# Patient Record
Sex: Female | Born: 1935 | Race: Black or African American | Hispanic: No | Marital: Single | Smoking: Former smoker
Health system: Southern US, Community
[De-identification: ages and names within clinical notes are randomized; demographics above are authoritative.]

## PROBLEM LIST (undated history)

## (undated) DIAGNOSIS — N185 Chronic kidney disease, stage 5: Secondary | ICD-10-CM

## (undated) DIAGNOSIS — D649 Anemia, unspecified: Secondary | ICD-10-CM

## (undated) DIAGNOSIS — I10 Essential (primary) hypertension: Secondary | ICD-10-CM

## (undated) HISTORY — PX: ABDOMINAL HYSTERECTOMY: SHX81

---

## 2008-04-20 ENCOUNTER — Encounter: Payer: Self-pay | Admitting: Emergency Medicine

## 2008-04-20 ENCOUNTER — Inpatient Hospital Stay (HOSPITAL_COMMUNITY): Admission: AD | Admit: 2008-04-20 | Discharge: 2008-04-27 | Payer: Self-pay | Admitting: Cardiology

## 2008-04-20 ENCOUNTER — Ambulatory Visit: Payer: Self-pay | Admitting: Cardiology

## 2008-04-21 ENCOUNTER — Encounter: Payer: Self-pay | Admitting: Cardiology

## 2008-05-04 ENCOUNTER — Ambulatory Visit: Payer: Self-pay | Admitting: Cardiology

## 2008-05-04 LAB — CONVERTED CEMR LAB
BUN: 14 mg/dL (ref 6–23)
CO2: 29 meq/L (ref 19–32)
Calcium: 8.5 mg/dL (ref 8.4–10.5)
Chloride: 105 meq/L (ref 96–112)
Creatinine, Ser: 1 mg/dL (ref 0.4–1.2)
GFR calc Af Amer: 70 mL/min
GFR calc non Af Amer: 58 mL/min
Glucose, Bld: 90 mg/dL (ref 70–99)
Potassium: 4.3 meq/L (ref 3.5–5.1)
Sodium: 139 meq/L (ref 135–145)

## 2008-05-17 ENCOUNTER — Ambulatory Visit: Payer: Self-pay | Admitting: Cardiology

## 2008-05-25 ENCOUNTER — Encounter (HOSPITAL_COMMUNITY): Admission: RE | Admit: 2008-05-25 | Discharge: 2008-08-23 | Payer: Self-pay | Admitting: Cardiovascular Disease

## 2008-06-29 ENCOUNTER — Ambulatory Visit: Payer: Self-pay | Admitting: Cardiovascular Disease

## 2008-06-29 LAB — CONVERTED CEMR LAB
BUN: 22 mg/dL (ref 6–23)
CO2: 24 meq/L (ref 19–32)
Calcium: 8.1 mg/dL — ABNORMAL LOW (ref 8.4–10.5)
Chloride: 103 meq/L (ref 96–112)
Creatinine, Ser: 1.2 mg/dL (ref 0.4–1.2)
GFR calc Af Amer: 57 mL/min
GFR calc non Af Amer: 47 mL/min
Glucose, Bld: 83 mg/dL (ref 70–99)
Potassium: 6.1 meq/L (ref 3.5–5.1)
Sodium: 131 meq/L — ABNORMAL LOW (ref 135–145)

## 2008-07-03 ENCOUNTER — Ambulatory Visit: Payer: Self-pay | Admitting: Cardiovascular Disease

## 2008-07-03 LAB — CONVERTED CEMR LAB
BUN: 22 mg/dL (ref 6–23)
BUN: 22 mg/dL (ref 6–23)
CO2: 24 meq/L (ref 19–32)
CO2: 24 meq/L (ref 19–32)
Calcium: 8.1 mg/dL — ABNORMAL LOW (ref 8.4–10.5)
Calcium: 7.6 mg/dL — ABNORMAL LOW (ref 8.4–10.5)
Chloride: 103 meq/L (ref 96–112)
Chloride: 103 meq/L (ref 96–112)
Creatinine, Ser: 1.2 mg/dL (ref 0.4–1.2)
Creatinine, Ser: 1 mg/dL (ref 0.4–1.2)
GFR calc Af Amer: 57 mL/min
GFR calc Af Amer: 70 mL/min
GFR calc non Af Amer: 47 mL/min
GFR calc non Af Amer: 58 mL/min
Glucose, Bld: 83 mg/dL (ref 70–99)
Glucose, Bld: 103 mg/dL — ABNORMAL HIGH (ref 70–99)
Potassium: 6.1 meq/L (ref 3.5–5.1)
Potassium: 4.5 meq/L (ref 3.5–5.1)
Sodium: 131 meq/L — ABNORMAL LOW (ref 135–145)
Sodium: 130 meq/L — ABNORMAL LOW (ref 135–145)

## 2008-07-12 ENCOUNTER — Ambulatory Visit: Payer: Self-pay | Admitting: Cardiology

## 2008-07-12 LAB — CONVERTED CEMR LAB
BUN: 28 mg/dL — ABNORMAL HIGH (ref 6–23)
Basophils Absolute: 0 10*3/uL (ref 0.0–0.1)
Basophils Relative: 0.6 % (ref 0.0–3.0)
CO2: 27 meq/L (ref 19–32)
Calcium: 7.6 mg/dL — ABNORMAL LOW (ref 8.4–10.5)
Chloride: 92 meq/L — ABNORMAL LOW (ref 96–112)
Creatinine, Ser: 1.2 mg/dL (ref 0.4–1.2)
Eosinophils Absolute: 0.6 10*3/uL (ref 0.0–0.7)
Eosinophils Relative: 11 % — ABNORMAL HIGH (ref 0.0–5.0)
GFR calc Af Amer: 56 mL/min
GFR calc non Af Amer: 47 mL/min
Glucose, Bld: 83 mg/dL (ref 70–99)
HCT: 31.7 % — ABNORMAL LOW (ref 36.0–46.0)
Hemoglobin: 11 g/dL — ABNORMAL LOW (ref 12.0–15.0)
Iron: 41 ug/dL — ABNORMAL LOW (ref 42–145)
Lymphocytes Relative: 27.3 % (ref 12.0–46.0)
MCHC: 34.7 g/dL (ref 30.0–36.0)
MCV: 92.1 fL (ref 78.0–100.0)
Monocytes Absolute: 0.4 10*3/uL (ref 0.1–1.0)
Monocytes Relative: 7.8 % (ref 3.0–12.0)
Neutro Abs: 3 10*3/uL (ref 1.4–7.7)
Neutrophils Relative %: 53.3 % (ref 43.0–77.0)
Platelets: 217 10*3/uL (ref 150–400)
Potassium: 4.3 meq/L (ref 3.5–5.1)
Pro B Natriuretic peptide (BNP): 309 pg/mL — ABNORMAL HIGH (ref 0.0–100.0)
RBC: 3.45 M/uL — ABNORMAL LOW (ref 3.87–5.11)
RDW: 13.7 % (ref 11.5–14.6)
Saturation Ratios: 26.2 % (ref 20.0–50.0)
Sodium: 126 meq/L — ABNORMAL LOW (ref 135–145)
Transferrin: 111.8 mg/dL — ABNORMAL LOW (ref >=212.0)
WBC: 5.5 10*3/uL (ref 4.5–10.5)

## 2008-07-19 ENCOUNTER — Encounter: Payer: Self-pay | Admitting: Cardiology

## 2008-07-19 ENCOUNTER — Ambulatory Visit: Payer: Self-pay | Admitting: Internal Medicine

## 2008-07-19 ENCOUNTER — Ambulatory Visit: Payer: Self-pay

## 2008-07-19 LAB — CONVERTED CEMR LAB
BUN: 28 mg/dL — ABNORMAL HIGH (ref 6–23)
CO2: 31 meq/L (ref 19–32)
Calcium: 8 mg/dL — ABNORMAL LOW (ref 8.4–10.5)
Chloride: 100 meq/L (ref 96–112)
Creatinine, Ser: 1.2 mg/dL (ref 0.4–1.2)
GFR calc Af Amer: 56 mL/min
GFR calc non Af Amer: 47 mL/min
Glucose, Bld: 81 mg/dL (ref 70–99)
Potassium: 4.7 meq/L (ref 3.5–5.1)
Pro B Natriuretic peptide (BNP): 310 pg/mL — ABNORMAL HIGH (ref 0.0–100.0)
Sodium: 135 meq/L (ref 135–145)

## 2008-07-28 ENCOUNTER — Ambulatory Visit: Payer: Self-pay | Admitting: Cardiovascular Disease

## 2008-08-02 ENCOUNTER — Ambulatory Visit: Payer: Self-pay | Admitting: Cardiovascular Disease

## 2008-08-02 LAB — CONVERTED CEMR LAB
BUN: 25 mg/dL — ABNORMAL HIGH (ref 6–23)
CO2: 33 meq/L — ABNORMAL HIGH (ref 19–32)
Calcium: 7.7 mg/dL — ABNORMAL LOW (ref 8.4–10.5)
Chloride: 96 meq/L (ref 96–112)
Creatinine, Ser: 1.3 mg/dL — ABNORMAL HIGH (ref 0.4–1.2)
GFR calc Af Amer: 51 mL/min
GFR calc non Af Amer: 43 mL/min
Glucose, Bld: 80 mg/dL (ref 70–99)
Potassium: 4.2 meq/L (ref 3.5–5.1)
Sodium: 133 meq/L — ABNORMAL LOW (ref 135–145)

## 2008-08-08 ENCOUNTER — Ambulatory Visit: Payer: Self-pay | Admitting: Cardiovascular Disease

## 2008-08-14 ENCOUNTER — Ambulatory Visit: Payer: Self-pay

## 2008-08-14 LAB — CONVERTED CEMR LAB
BUN: 29 mg/dL — ABNORMAL HIGH (ref 6–23)
CO2: 32 meq/L (ref 19–32)
Calcium: 7.5 mg/dL — ABNORMAL LOW (ref 8.4–10.5)
Chloride: 92 meq/L — ABNORMAL LOW (ref 96–112)
Creatinine, Ser: 1.3 mg/dL — ABNORMAL HIGH (ref 0.4–1.2)
GFR calc Af Amer: 51 mL/min
GFR calc non Af Amer: 43 mL/min
Glucose, Bld: 94 mg/dL (ref 70–99)
Potassium: 3.8 meq/L (ref 3.5–5.1)
Pro B Natriuretic peptide (BNP): 219 pg/mL — ABNORMAL HIGH (ref 0.0–100.0)
Sodium: 129 meq/L — ABNORMAL LOW (ref 135–145)

## 2008-08-24 ENCOUNTER — Encounter (HOSPITAL_COMMUNITY): Admission: RE | Admit: 2008-08-24 | Discharge: 2008-10-09 | Payer: Self-pay | Admitting: Cardiovascular Disease

## 2008-08-30 ENCOUNTER — Ambulatory Visit: Payer: Self-pay | Admitting: Cardiovascular Disease

## 2008-08-30 LAB — CONVERTED CEMR LAB
BUN: 23 mg/dL (ref 6–23)
CO2: 31 meq/L (ref 19–32)
Calcium: 7.5 mg/dL — ABNORMAL LOW (ref 8.4–10.5)
Chloride: 86 meq/L — ABNORMAL LOW (ref 96–112)
Creatinine, Ser: 1.4 mg/dL — ABNORMAL HIGH (ref 0.4–1.2)
GFR calc Af Amer: 47 mL/min
GFR calc non Af Amer: 39 mL/min
Glucose, Bld: 97 mg/dL (ref 70–99)
Potassium: 3.7 meq/L (ref 3.5–5.1)
Pro B Natriuretic peptide (BNP): 298 pg/mL — ABNORMAL HIGH (ref 0.0–100.0)
Sodium: 121 meq/L — ABNORMAL LOW (ref 135–145)

## 2008-09-05 ENCOUNTER — Ambulatory Visit: Payer: Self-pay | Admitting: Cardiovascular Disease

## 2008-09-05 ENCOUNTER — Ambulatory Visit: Payer: Self-pay | Admitting: Pulmonary Disease

## 2008-09-05 ENCOUNTER — Inpatient Hospital Stay (HOSPITAL_COMMUNITY): Admission: AD | Admit: 2008-09-05 | Discharge: 2008-09-14 | Payer: Self-pay | Admitting: Cardiovascular Disease

## 2008-09-21 ENCOUNTER — Ambulatory Visit: Payer: Self-pay | Admitting: Cardiology

## 2008-09-21 DIAGNOSIS — E876 Hypokalemia: Secondary | ICD-10-CM | POA: Insufficient documentation

## 2008-09-21 DIAGNOSIS — I5033 Acute on chronic diastolic (congestive) heart failure: Secondary | ICD-10-CM | POA: Insufficient documentation

## 2008-09-21 DIAGNOSIS — R635 Abnormal weight gain: Secondary | ICD-10-CM

## 2008-09-21 DIAGNOSIS — R0602 Shortness of breath: Secondary | ICD-10-CM | POA: Insufficient documentation

## 2008-09-21 DIAGNOSIS — J984 Other disorders of lung: Secondary | ICD-10-CM | POA: Insufficient documentation

## 2008-09-21 DIAGNOSIS — R188 Other ascites: Secondary | ICD-10-CM | POA: Insufficient documentation

## 2008-09-21 DIAGNOSIS — I2510 Atherosclerotic heart disease of native coronary artery without angina pectoris: Secondary | ICD-10-CM | POA: Insufficient documentation

## 2008-09-21 LAB — CONVERTED CEMR LAB
BUN: 32 mg/dL — ABNORMAL HIGH (ref 6–23)
CO2: 27 meq/L (ref 19–32)
Calcium: 7.8 mg/dL — ABNORMAL LOW (ref 8.4–10.5)
Chloride: 106 meq/L (ref 96–112)
Creatinine, Ser: 1.7 mg/dL — ABNORMAL HIGH (ref 0.4–1.2)
GFR calc Af Amer: 38 mL/min
GFR calc non Af Amer: 31 mL/min
Glucose, Bld: 101 mg/dL — ABNORMAL HIGH (ref 70–99)
Potassium: 4.5 meq/L (ref 3.5–5.1)
Pro B Natriuretic peptide (BNP): 605 pg/mL — ABNORMAL HIGH (ref 0.0–100.0)
Sodium: 137 meq/L (ref 135–145)

## 2008-10-11 ENCOUNTER — Ambulatory Visit: Payer: Self-pay | Admitting: Cardiovascular Disease

## 2008-10-11 LAB — CONVERTED CEMR LAB
ALT: 13 units/L (ref 0–35)
AST: 22 units/L (ref 0–37)
Albumin: 1 g/dL — ABNORMAL LOW (ref 3.5–5.2)
Alkaline Phosphatase: 78 units/L (ref 39–117)
BUN: 21 mg/dL (ref 6–23)
Bilirubin, Direct: 0.1 mg/dL (ref 0.0–0.3)
CO2: 27 meq/L (ref 19–32)
Calcium: 7.8 mg/dL — ABNORMAL LOW (ref 8.4–10.5)
Chloride: 109 meq/L (ref 96–112)
Creatinine, Ser: 1.8 mg/dL — ABNORMAL HIGH (ref 0.4–1.2)
GFR calc Af Amer: 35 mL/min
GFR calc non Af Amer: 29 mL/min
Glucose, Bld: 96 mg/dL (ref 70–99)
Potassium: 4 meq/L (ref 3.5–5.1)
Sodium: 140 meq/L (ref 135–145)
Total Bilirubin: 0.3 mg/dL (ref 0.3–1.2)
Total Protein: 4.7 g/dL — ABNORMAL LOW (ref 6.0–8.3)

## 2008-10-13 ENCOUNTER — Ambulatory Visit: Payer: Self-pay | Admitting: Cardiovascular Disease

## 2008-10-24 ENCOUNTER — Ambulatory Visit: Payer: Self-pay | Admitting: Cardiovascular Disease

## 2008-10-26 ENCOUNTER — Inpatient Hospital Stay (HOSPITAL_COMMUNITY): Admission: AD | Admit: 2008-10-26 | Discharge: 2008-10-27 | Payer: Self-pay | Admitting: Cardiovascular Disease

## 2008-10-26 ENCOUNTER — Ambulatory Visit: Payer: Self-pay | Admitting: Cardiovascular Disease

## 2008-11-10 ENCOUNTER — Ambulatory Visit: Payer: Self-pay | Admitting: Cardiovascular Disease

## 2008-11-10 ENCOUNTER — Encounter: Payer: Self-pay | Admitting: Cardiovascular Disease

## 2008-11-10 DIAGNOSIS — R233 Spontaneous ecchymoses: Secondary | ICD-10-CM

## 2008-11-10 DIAGNOSIS — R609 Edema, unspecified: Secondary | ICD-10-CM | POA: Insufficient documentation

## 2008-11-16 ENCOUNTER — Encounter (INDEPENDENT_AMBULATORY_CARE_PROVIDER_SITE_OTHER): Payer: Self-pay | Admitting: Nephrology

## 2008-11-16 ENCOUNTER — Ambulatory Visit (HOSPITAL_COMMUNITY): Admission: RE | Admit: 2008-11-16 | Discharge: 2008-11-16 | Payer: Self-pay | Admitting: Nephrology

## 2008-11-24 ENCOUNTER — Encounter (HOSPITAL_COMMUNITY): Admission: RE | Admit: 2008-11-24 | Discharge: 2009-02-22 | Payer: Self-pay | Admitting: Nephrology

## 2008-12-04 ENCOUNTER — Encounter: Admission: RE | Admit: 2008-12-04 | Discharge: 2008-12-04 | Payer: Self-pay | Admitting: Nephrology

## 2008-12-04 ENCOUNTER — Encounter: Payer: Self-pay | Admitting: Gastroenterology

## 2008-12-05 ENCOUNTER — Encounter: Admission: RE | Admit: 2008-12-05 | Discharge: 2008-12-05 | Payer: Self-pay | Admitting: Nephrology

## 2008-12-11 ENCOUNTER — Ambulatory Visit: Payer: Self-pay | Admitting: Cardiovascular Disease

## 2008-12-12 ENCOUNTER — Ambulatory Visit: Payer: Self-pay | Admitting: Gastroenterology

## 2008-12-26 ENCOUNTER — Ambulatory Visit: Payer: Self-pay | Admitting: Gastroenterology

## 2008-12-26 ENCOUNTER — Encounter: Payer: Self-pay | Admitting: Gastroenterology

## 2008-12-29 ENCOUNTER — Encounter: Payer: Self-pay | Admitting: Gastroenterology

## 2009-01-01 ENCOUNTER — Encounter: Payer: Self-pay | Admitting: Gastroenterology

## 2009-01-05 ENCOUNTER — Ambulatory Visit: Payer: Self-pay | Admitting: Hematology and Oncology

## 2009-01-12 ENCOUNTER — Encounter: Payer: Self-pay | Admitting: Gastroenterology

## 2009-01-19 ENCOUNTER — Encounter: Payer: Self-pay | Admitting: Gastroenterology

## 2009-02-13 ENCOUNTER — Encounter: Payer: Self-pay | Admitting: Cardiovascular Disease

## 2009-02-23 ENCOUNTER — Encounter (HOSPITAL_COMMUNITY): Admission: RE | Admit: 2009-02-23 | Discharge: 2009-05-24 | Payer: Self-pay | Admitting: Nephrology

## 2009-02-26 DIAGNOSIS — D126 Benign neoplasm of colon, unspecified: Secondary | ICD-10-CM | POA: Insufficient documentation

## 2009-02-26 DIAGNOSIS — K5730 Diverticulosis of large intestine without perforation or abscess without bleeding: Secondary | ICD-10-CM | POA: Insufficient documentation

## 2009-02-27 ENCOUNTER — Ambulatory Visit: Payer: Self-pay | Admitting: Gastroenterology

## 2009-02-27 DIAGNOSIS — K219 Gastro-esophageal reflux disease without esophagitis: Secondary | ICD-10-CM

## 2009-02-27 DIAGNOSIS — N055 Unspecified nephritic syndrome with diffuse mesangiocapillary glomerulonephritis: Secondary | ICD-10-CM | POA: Insufficient documentation

## 2009-02-27 DIAGNOSIS — R933 Abnormal findings on diagnostic imaging of other parts of digestive tract: Secondary | ICD-10-CM

## 2009-02-28 ENCOUNTER — Telehealth: Payer: Self-pay | Admitting: Gastroenterology

## 2009-03-19 ENCOUNTER — Telehealth: Payer: Self-pay | Admitting: Gastroenterology

## 2009-03-23 ENCOUNTER — Encounter: Payer: Self-pay | Admitting: Cardiovascular Disease

## 2009-05-25 ENCOUNTER — Encounter (HOSPITAL_COMMUNITY): Admission: RE | Admit: 2009-05-25 | Discharge: 2009-08-24 | Payer: Self-pay | Admitting: Nephrology

## 2009-05-31 ENCOUNTER — Ambulatory Visit: Payer: Self-pay | Admitting: Cardiovascular Disease

## 2009-06-07 ENCOUNTER — Encounter: Payer: Self-pay | Admitting: Cardiovascular Disease

## 2009-07-01 ENCOUNTER — Encounter: Payer: Self-pay | Admitting: Cardiovascular Disease

## 2009-08-27 ENCOUNTER — Encounter (HOSPITAL_COMMUNITY): Admission: RE | Admit: 2009-08-27 | Discharge: 2009-11-25 | Payer: Self-pay | Admitting: Nephrology

## 2009-11-26 ENCOUNTER — Encounter: Payer: Self-pay | Admitting: Cardiovascular Disease

## 2009-12-07 ENCOUNTER — Encounter (HOSPITAL_COMMUNITY): Admission: RE | Admit: 2009-12-07 | Discharge: 2010-03-07 | Payer: Self-pay | Admitting: Nephrology

## 2010-03-15 ENCOUNTER — Encounter (HOSPITAL_COMMUNITY): Admission: RE | Admit: 2010-03-15 | Discharge: 2010-06-13 | Payer: Self-pay | Admitting: Nephrology

## 2010-03-23 IMAGING — CR DG CHEST 2V
2 series · 2 of 2 positions shown · non-contrast
Comparison: 04/22/2008

CLINICAL DATA: Congestive heart failure

CHEST - 2 VIEW

[w chest pa]
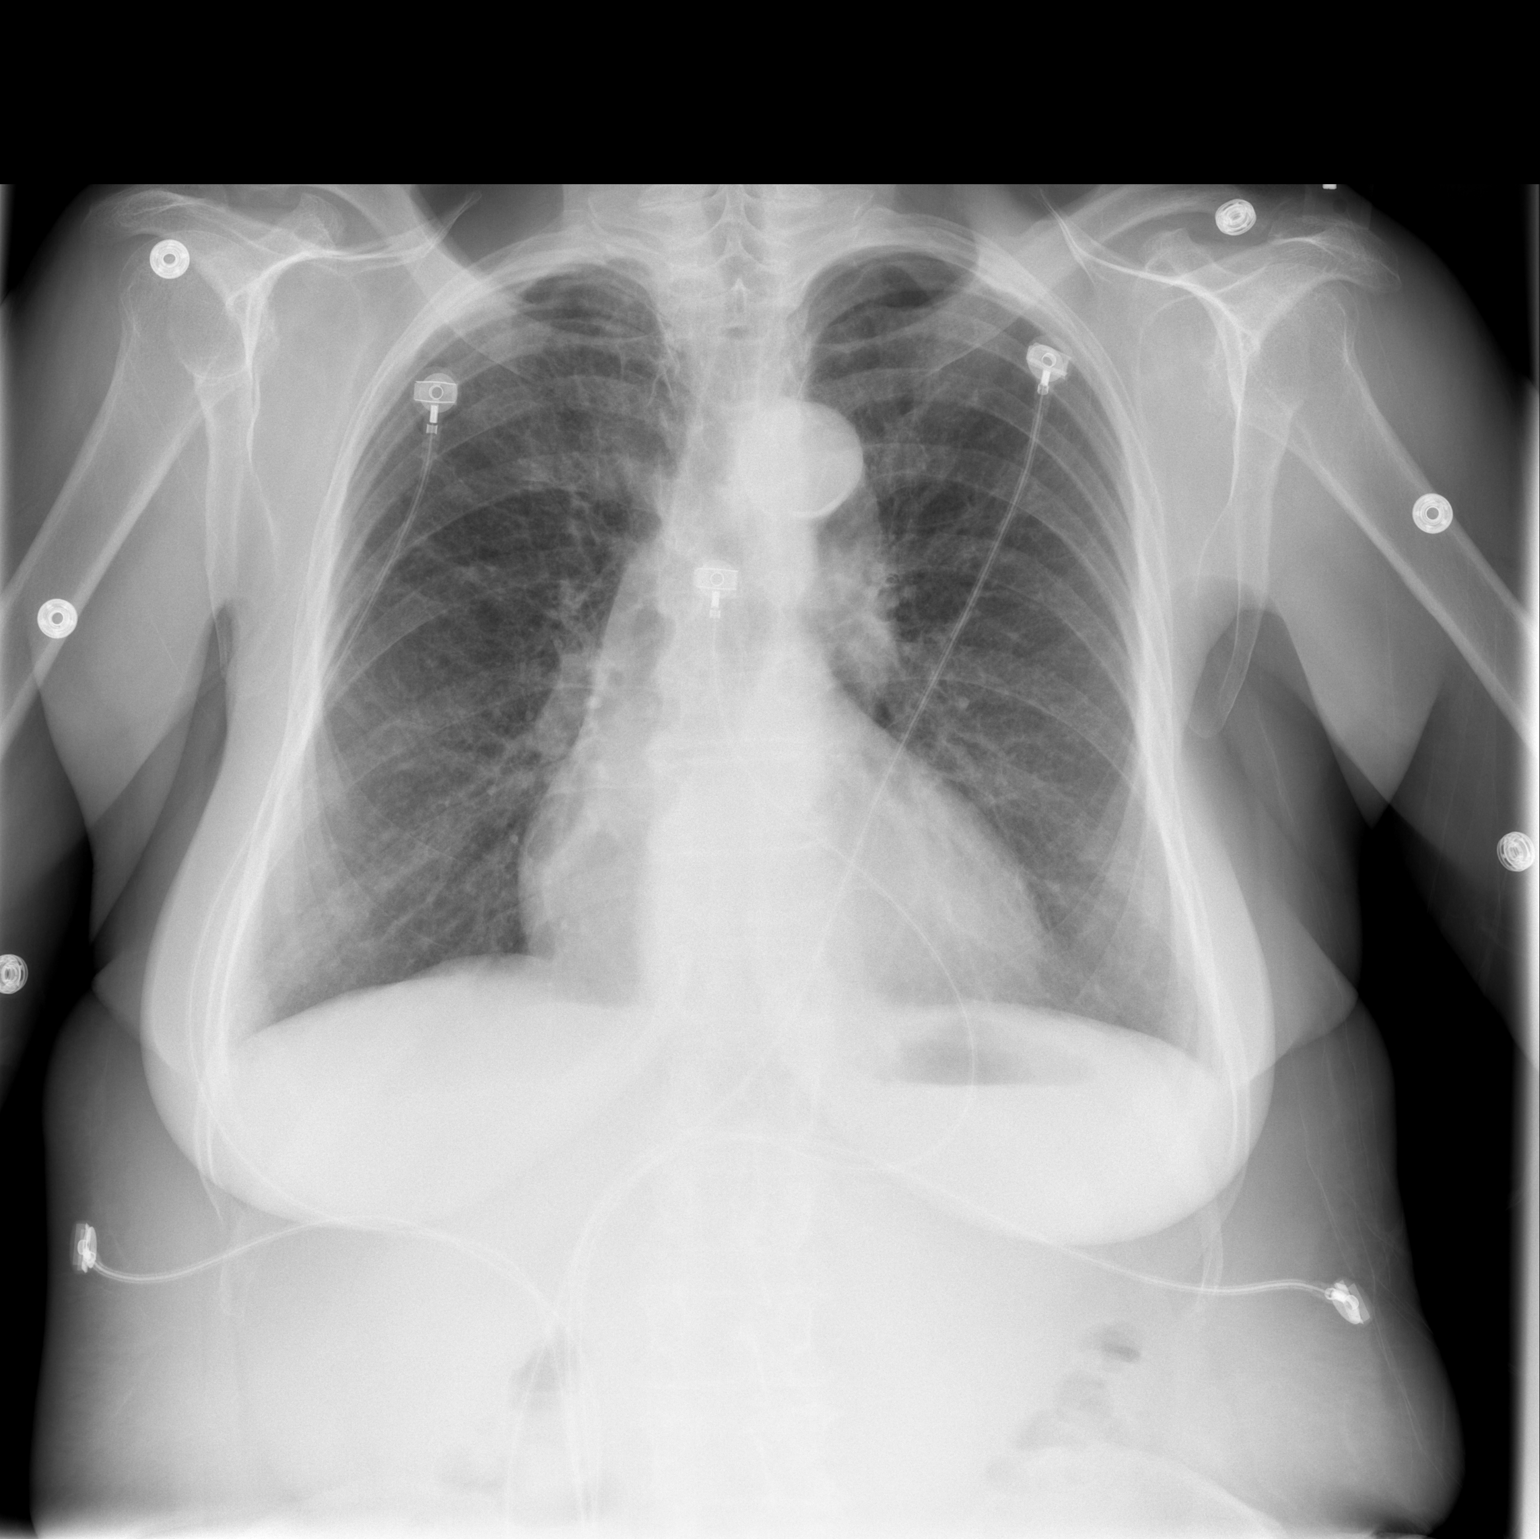

[w chest lat]
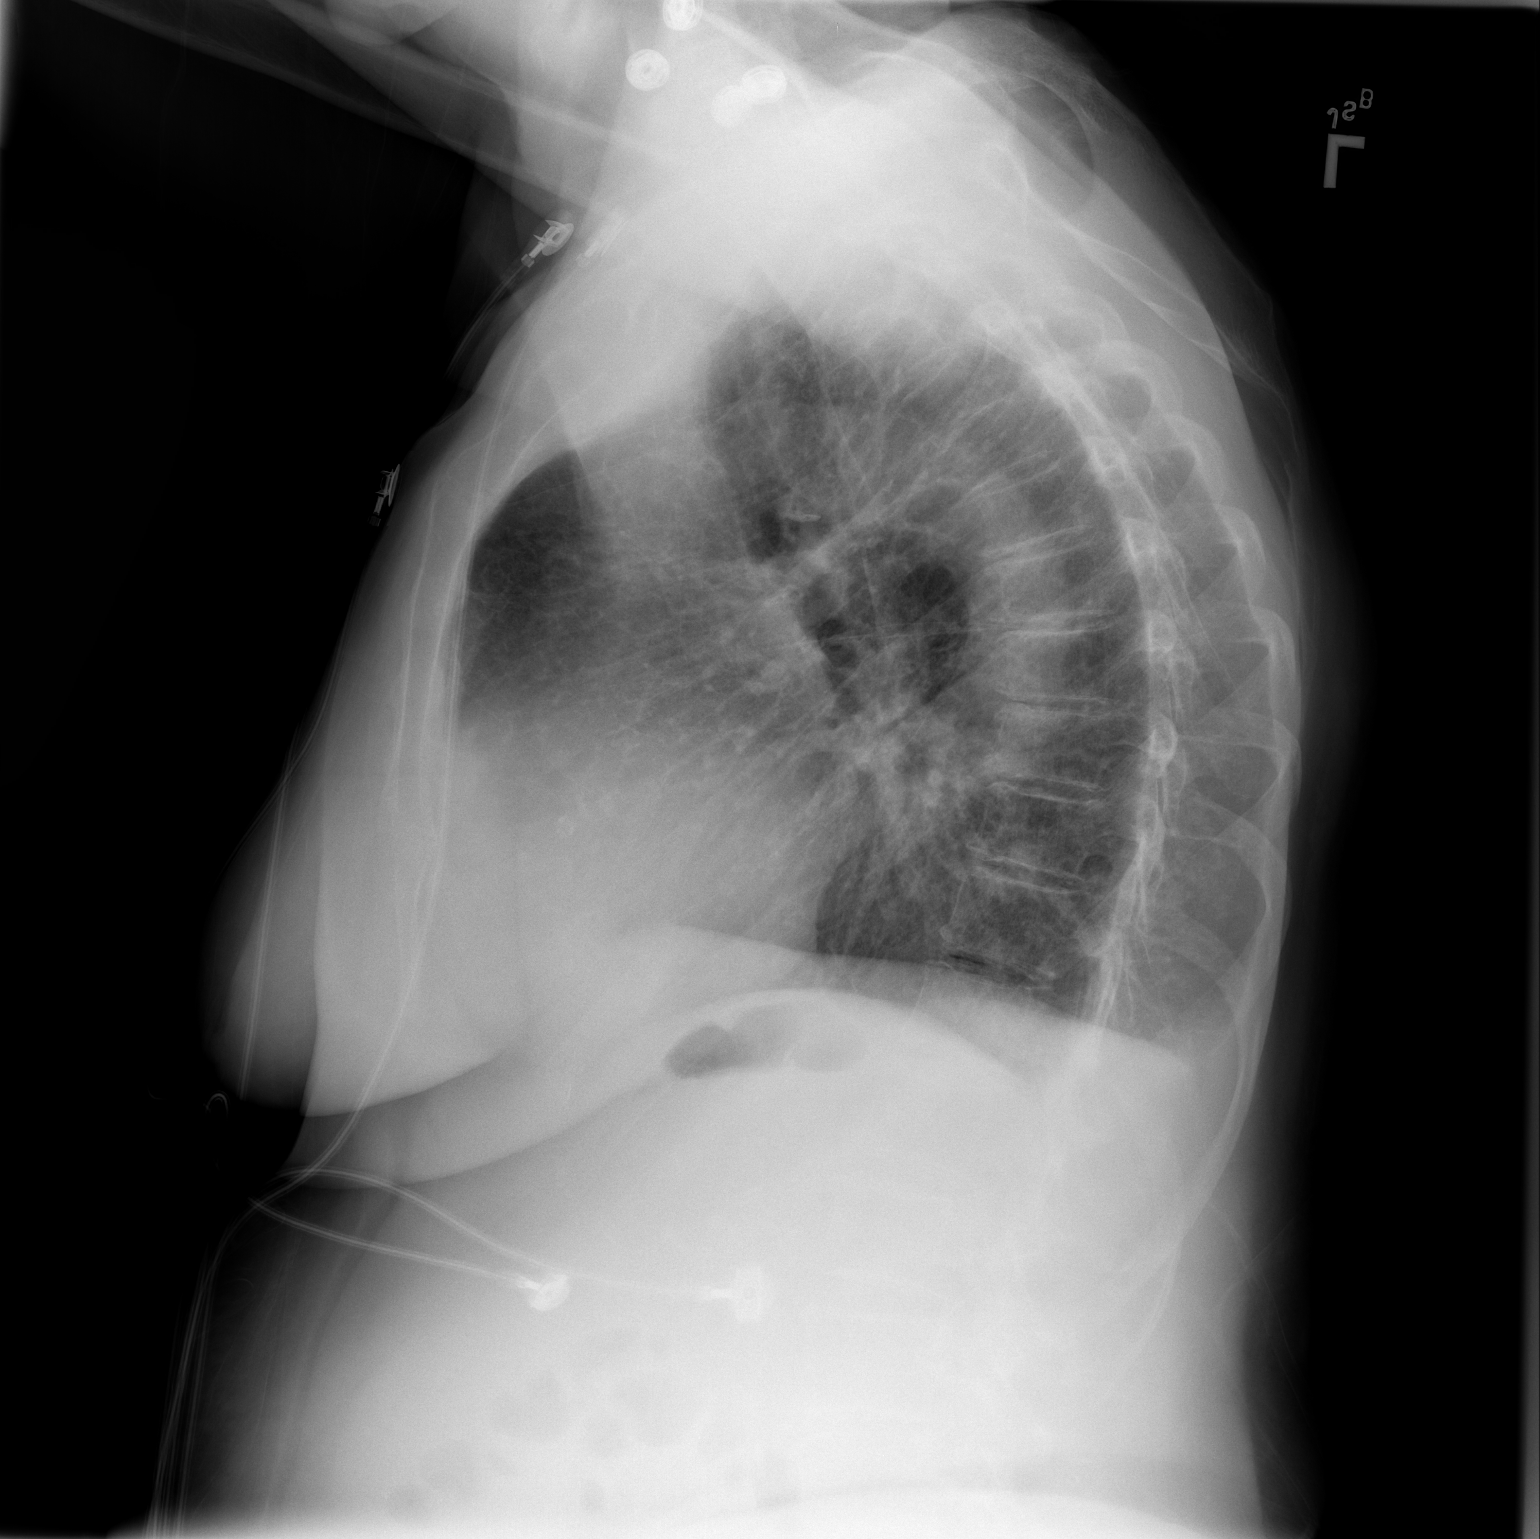

[2 of 2 positions shown; findings below may reference images not displayed]

FINDINGS: Heart size is normal.

There are small pleural effusions and pulmonary venous congestion.

No overt edema

Interstitial change consistent with COPD noted.

No airspace consolidation.
IMPRESSION: 1.  Small effusions and pulmonary venous congestion without overt
edema

## 2010-03-24 IMAGING — CR DG CHEST 1V PORT
1 series · 1 of 1 positions shown · non-contrast
Comparison: 09/05/2008

CLINICAL DATA: Line placement

PORTABLE CHEST - 1 VIEW

[AP]
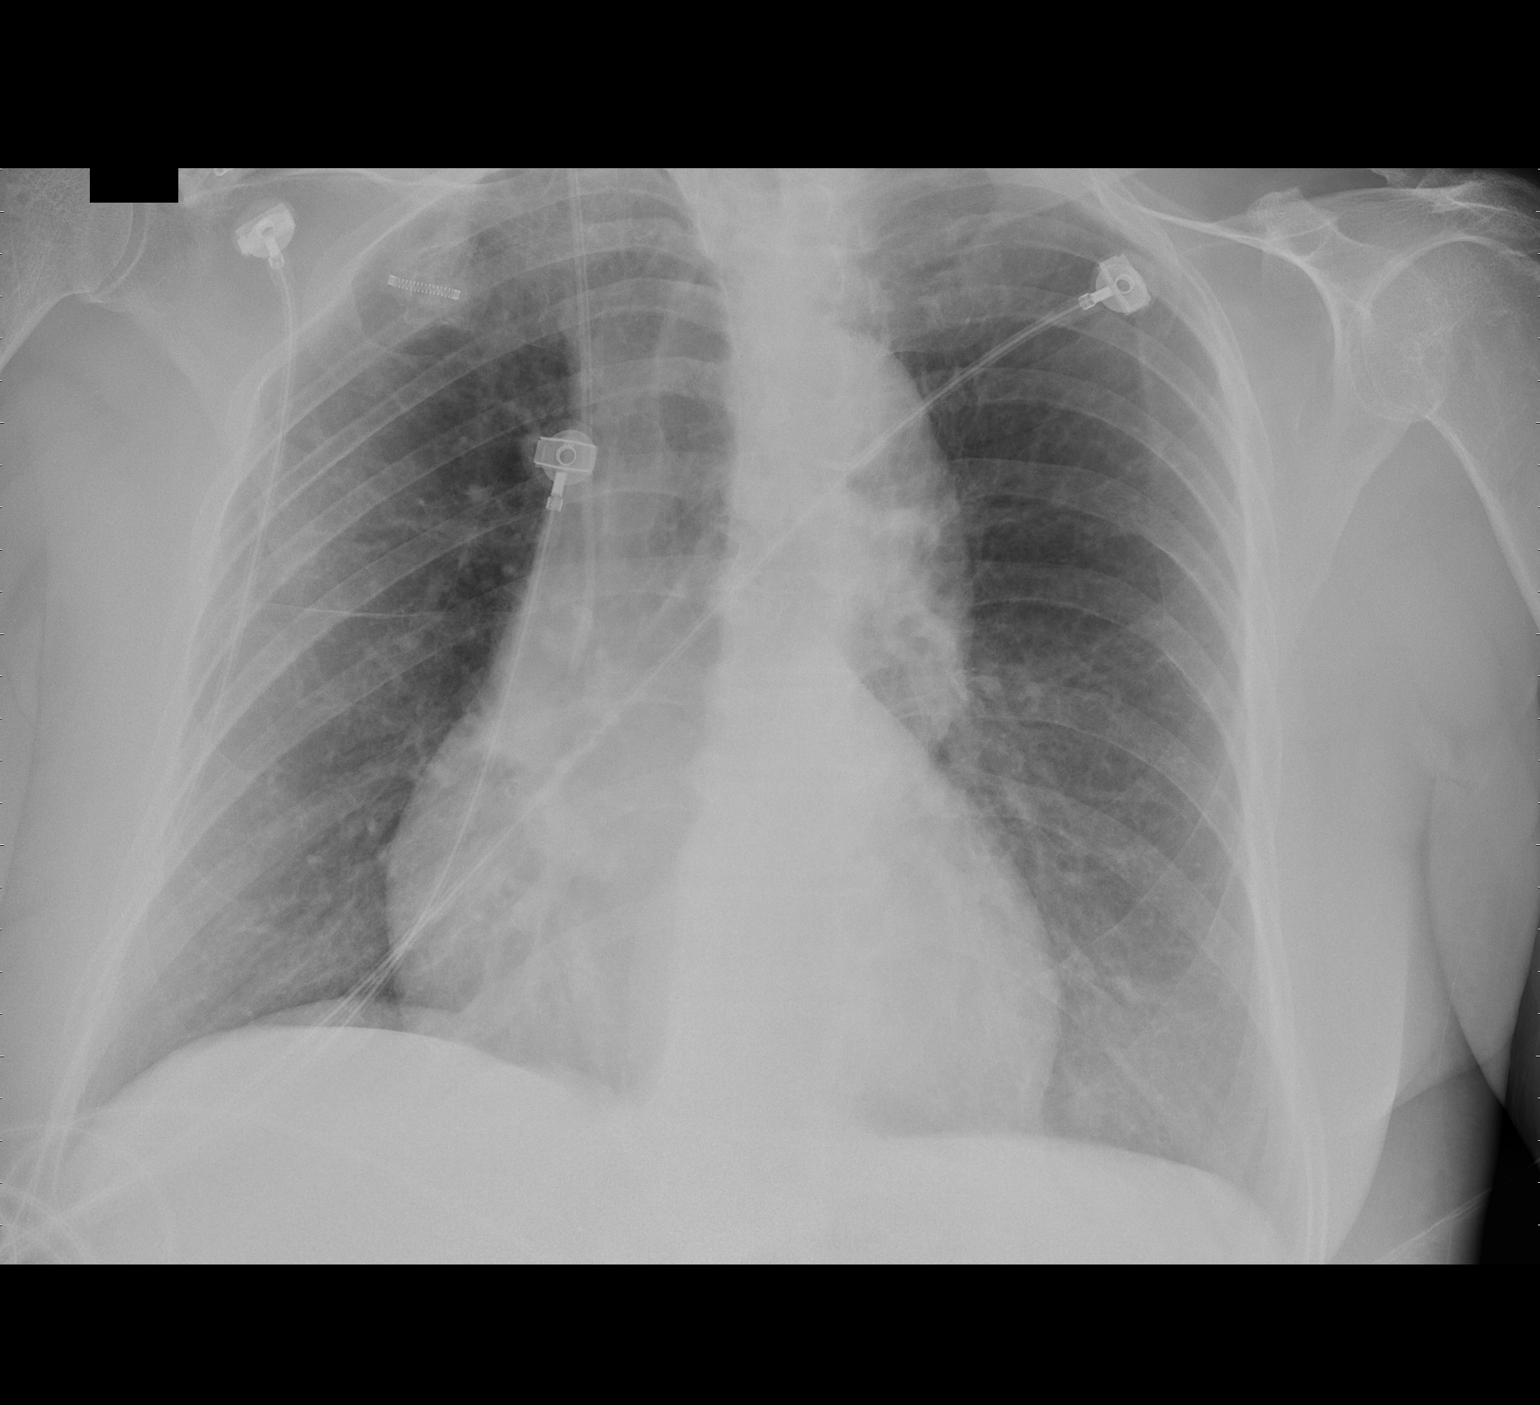

[1 of 1 positions shown; findings below may reference images not displayed]

FINDINGS: 1199 hours.  A right IJ central venous catheter tip is
unchanged near the SVC right atrial junction.  There is patient
rotation to the right.  No pneumothorax is demonstrated.  The lungs
are clear.  Heart size and mediastinal contours are stable with
mild aortic ectasia.
IMPRESSION: Central line positioned as above.  No pneumothorax or other acute
process.

## 2010-03-29 IMAGING — US US RENAL
1 series · 14 of 18 positions shown · non-contrast
Comparison: No priors

CLINICAL DATA: Worsening renal function

RENAL/URINARY TRACT ULTRASOUND
TECHNIQUE: Complete ultrasound examination of the urinary tract
was performed including evaluation of the kidneys, renal collecting
systems, and urinary bladder.

[Series 1: unknown · 0.33mm/px · 14 of 18 slices shown]
[im 1/18]
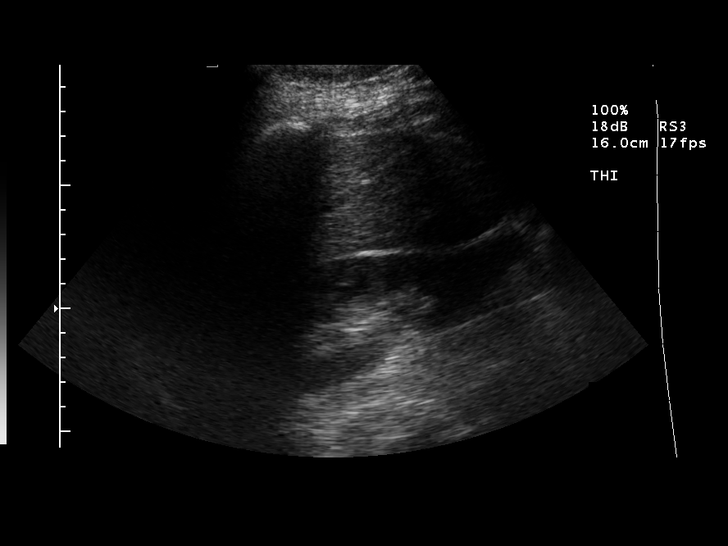
[im 2/18]
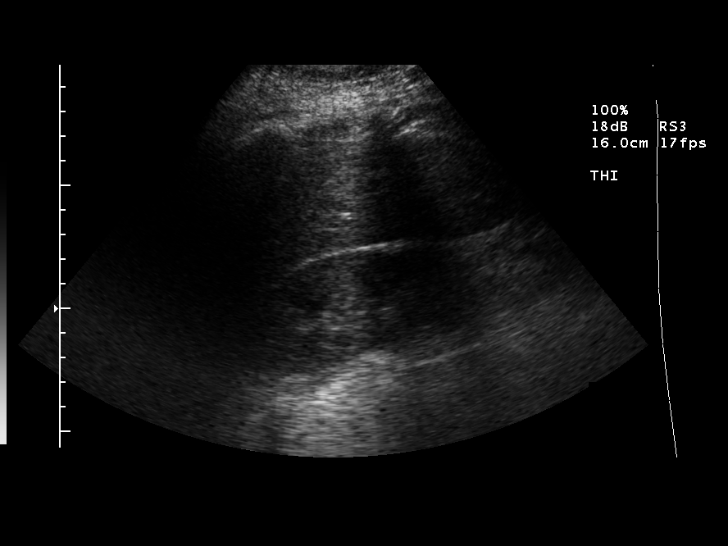
[im 4/18]
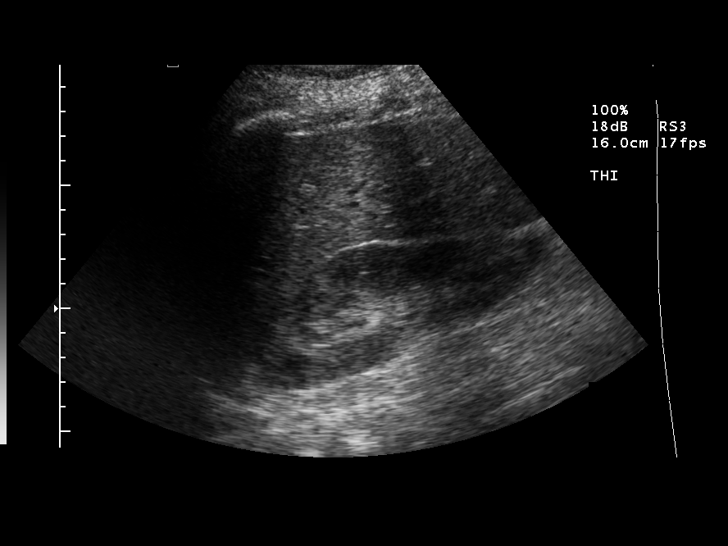
[im 5/18]
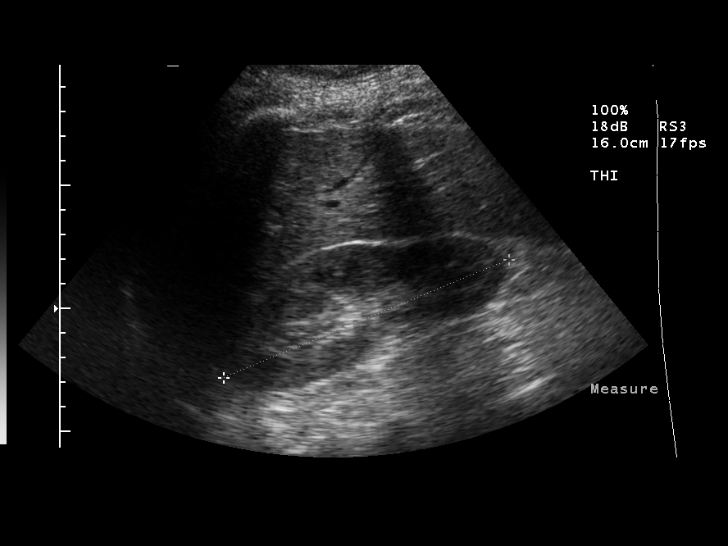
[im 6/18]
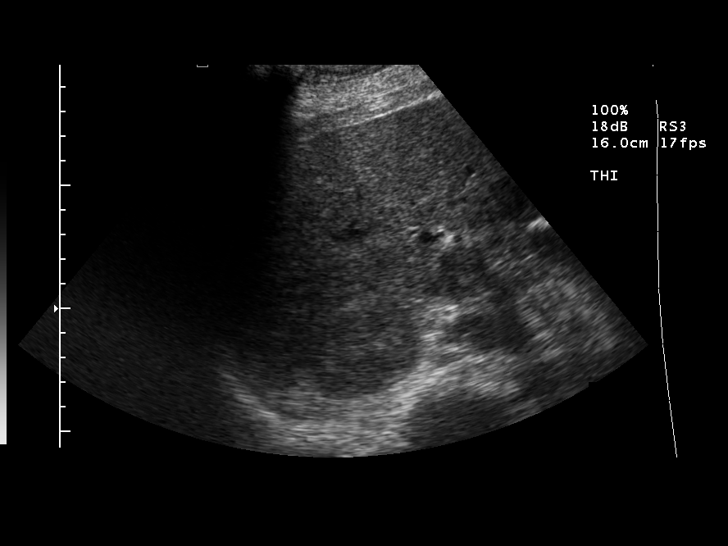
[im 8/18]
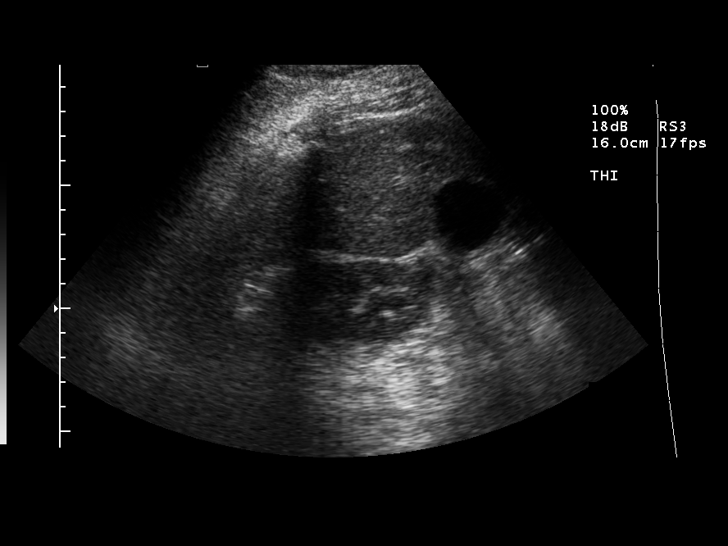
[im 9/18]
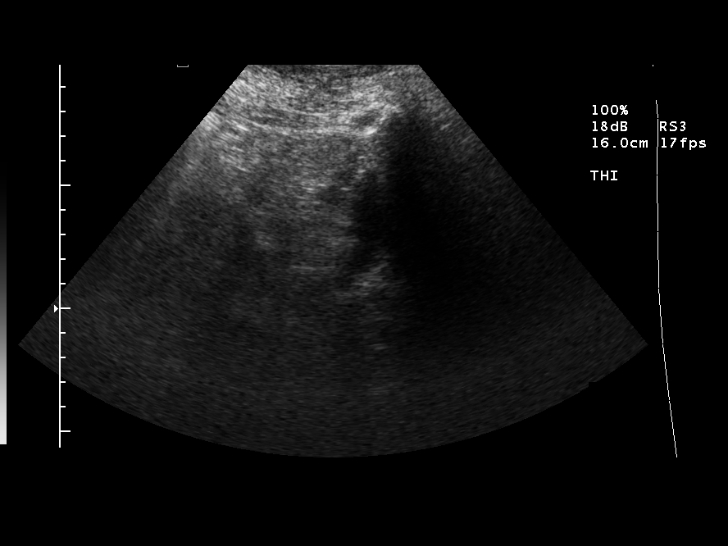
[im 10/18]
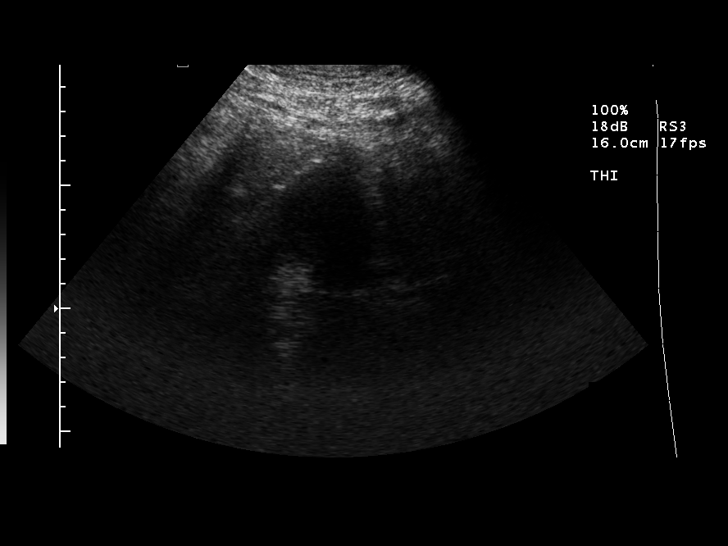
[im 11/18]
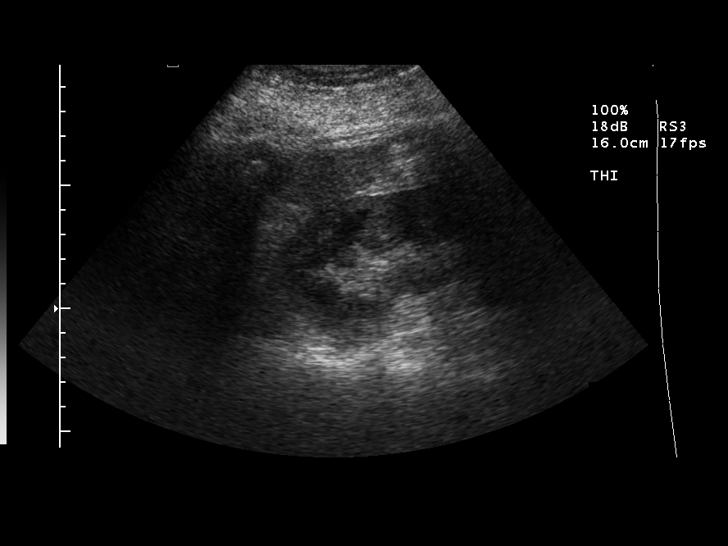
[im 13/18]
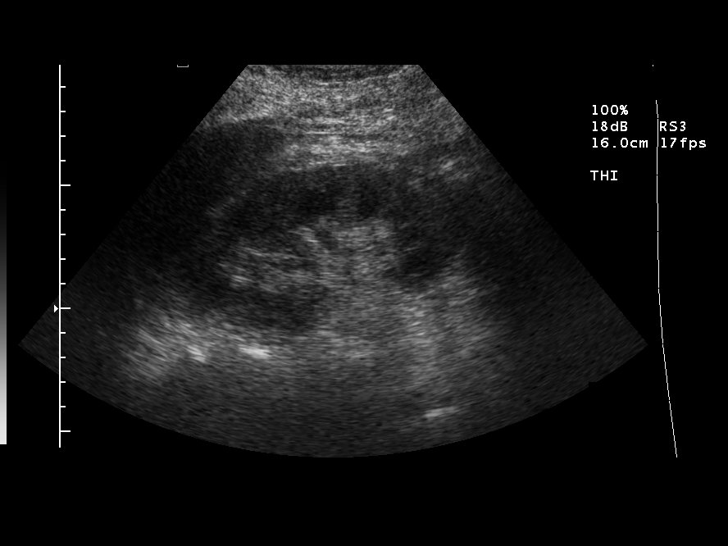
[im 14/18]
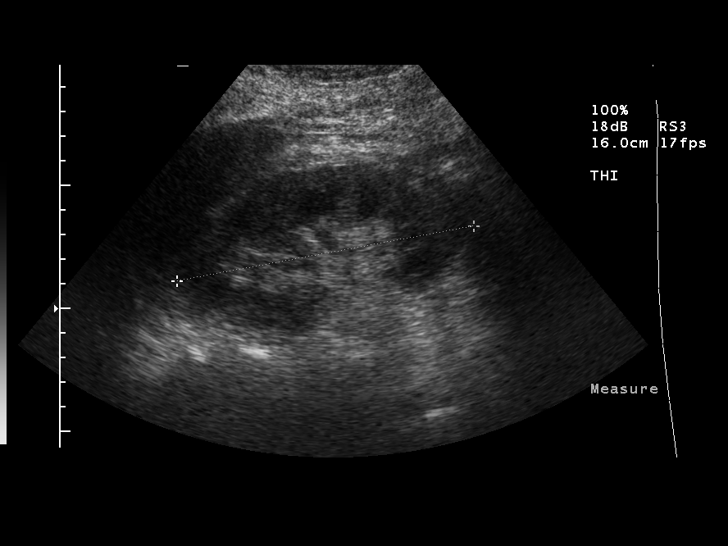
[im 15/18]
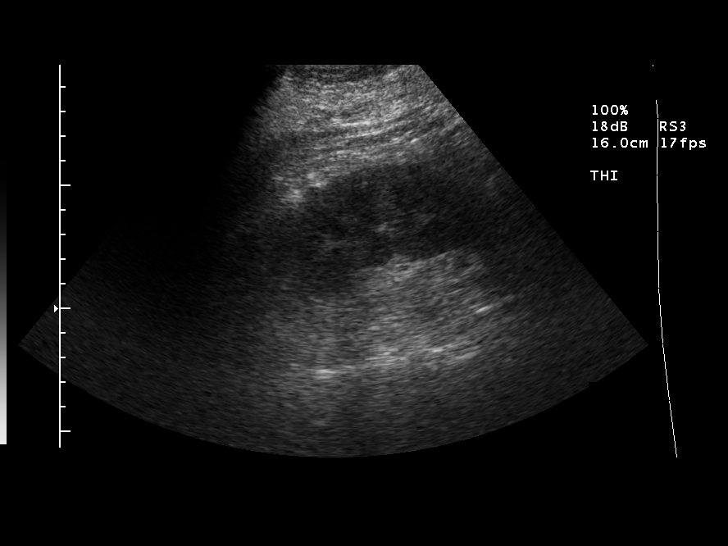
[im 17/18]
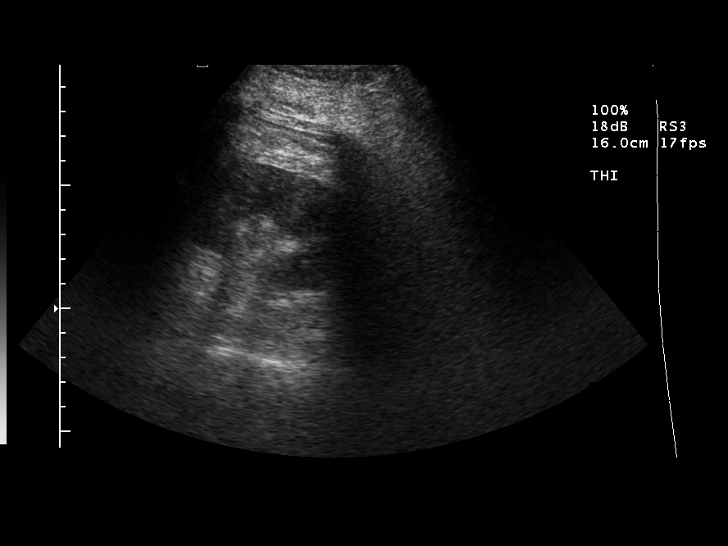
[im 18/18]
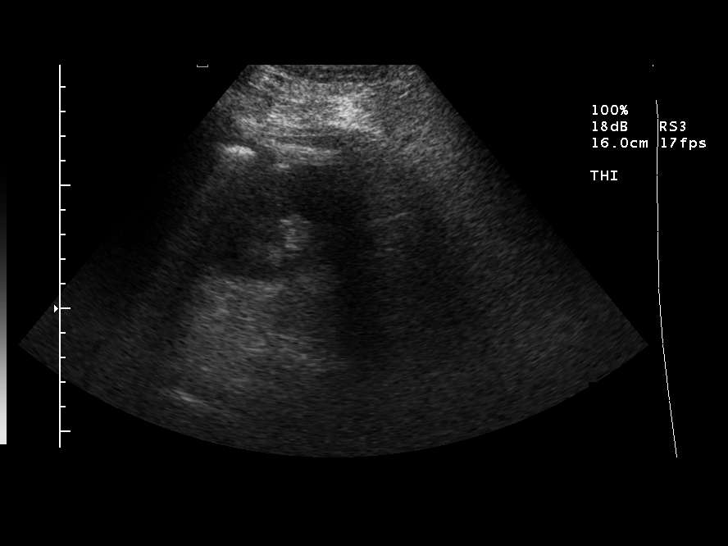

[14 of 18 positions shown; findings below may reference images not displayed]

FINDINGS: .  Kidneys size within normal limits with the right
and the left 2.2 cm.  No hydronephrosis.  No visible masses or
calculi.  No lesions of the bladder, although it is incompletely
distended.
IMPRESSION: 1. No pathological findings.

## 2010-06-03 IMAGING — US US BIOPSY
1 series · 11 of 11 positions shown · non-contrast
Comparison: none

Clinical Data/Indication: Proteinuria

Ultrasound-guided biopsy renal core biopsy
Sedation: Versed 0.5 mg, Fentanyl 15 mcg.
Total Moderate Sedation Time: Nine minutes.
Additional Medications: None.
Procedure: The procedure, risks, benefits, and alternatives were
explained to the patient. Questions regarding the procedure were
encouraged and answered. The patient understands and consents to
the procedure.
The right back was prepped withbetadine in a sterile fasion, and a
sterile drape was applied covering the operative field. A sterile
gown and sterile gloves were used for the procedure.
Under sonographic guidance, three 16 gauge core biopsies of the
right renal cortex were obtained. Final imaging was performed.
Patient tolerated the procedure well without complication.  Vital
sign monitoring by nursing staff during the procedure will continue
as patient is in the special procedures unit for post procedure
observation.

[Series 1: us biopsy · 0.32mm/px · 11 of 11 slices shown]
[im 1/11]
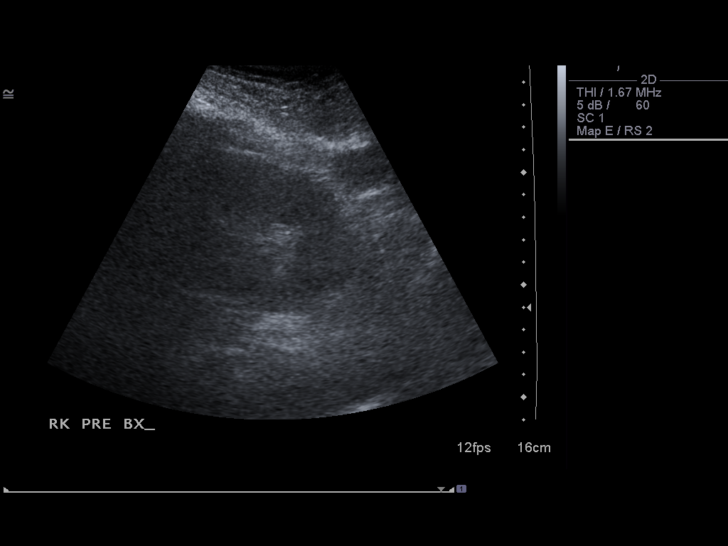
[im 2/11]
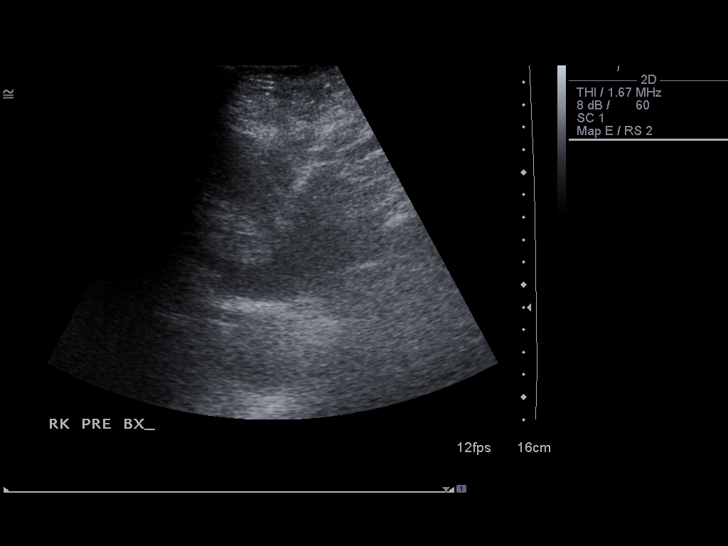
[im 3/11]
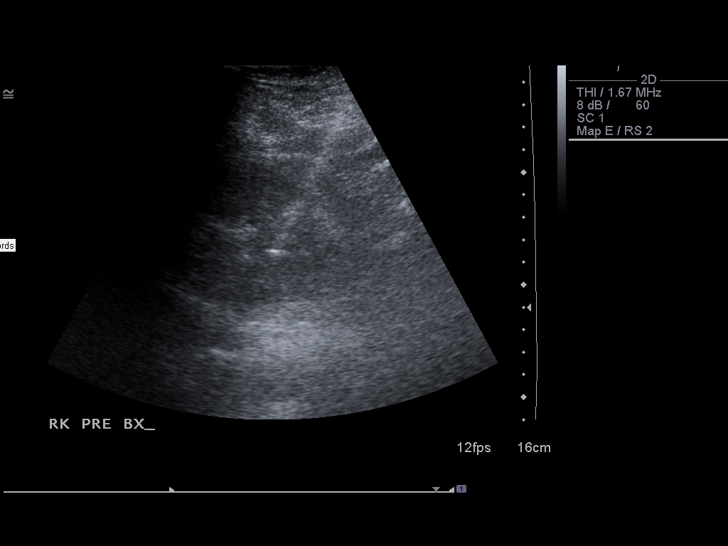
[im 4/11]
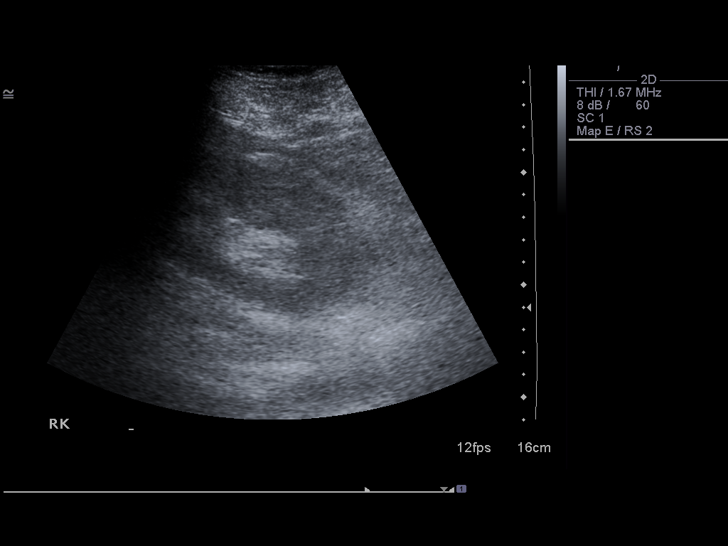
[im 5/11]
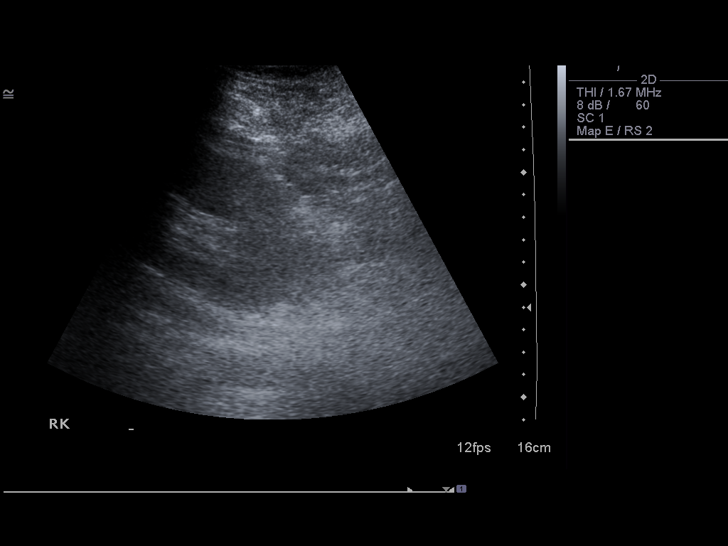
[im 6/11]
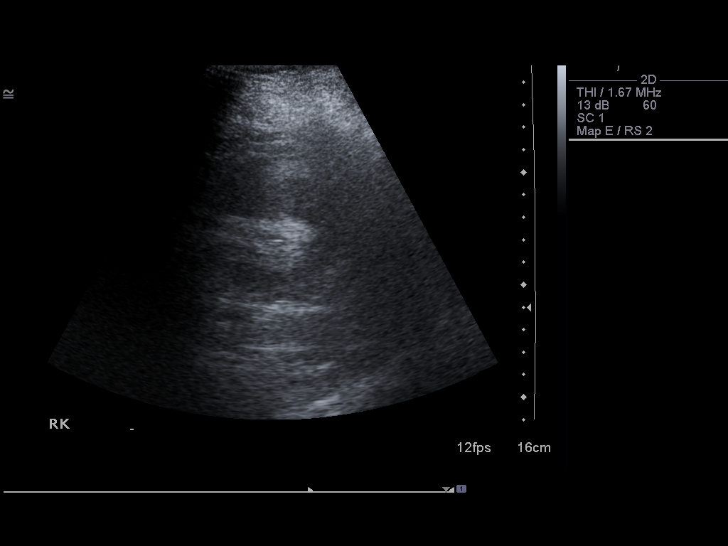
[im 7/11]
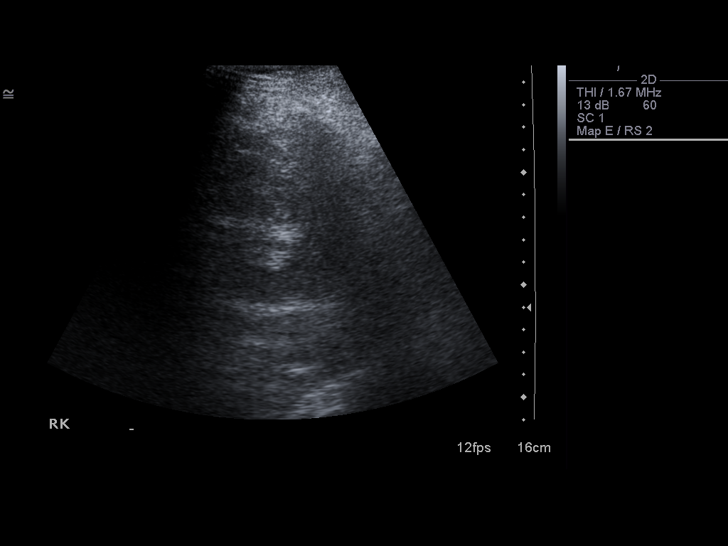
[im 8/11]
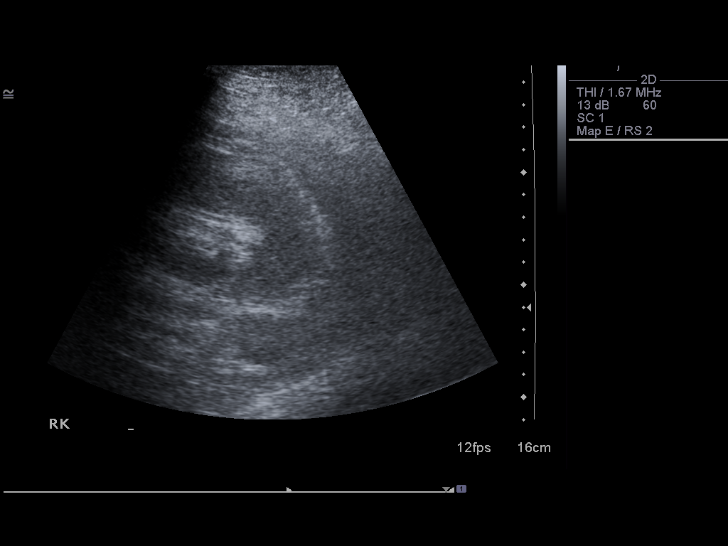
[im 9/11]
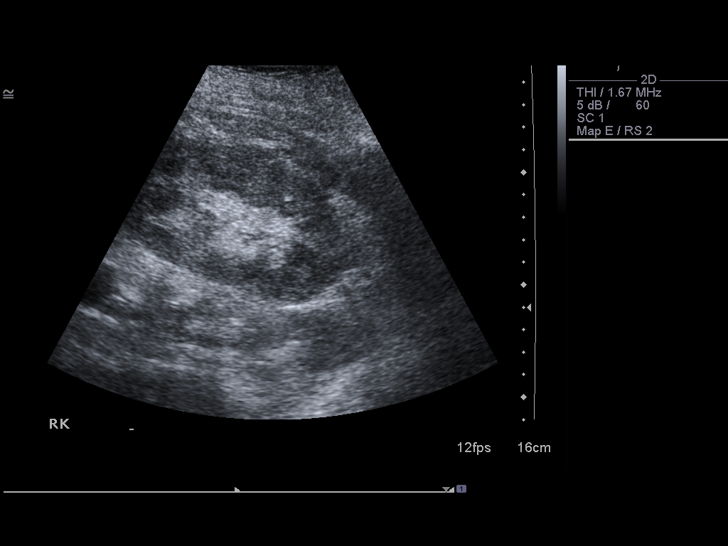
[im 10/11]
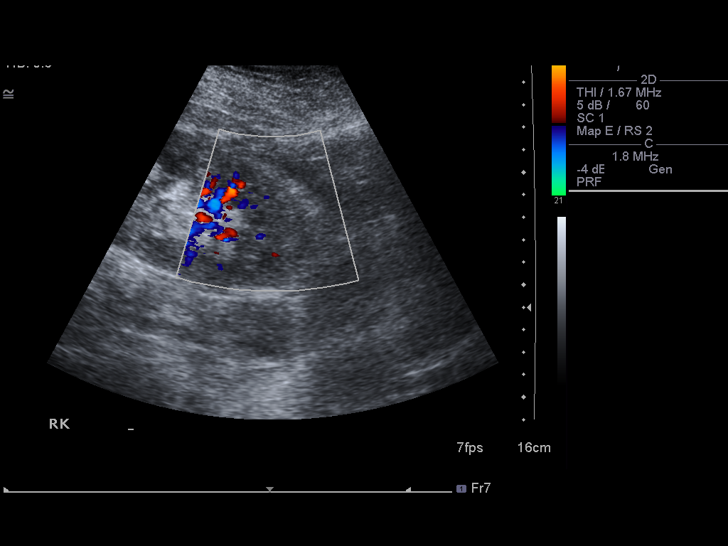
[im 11/11]
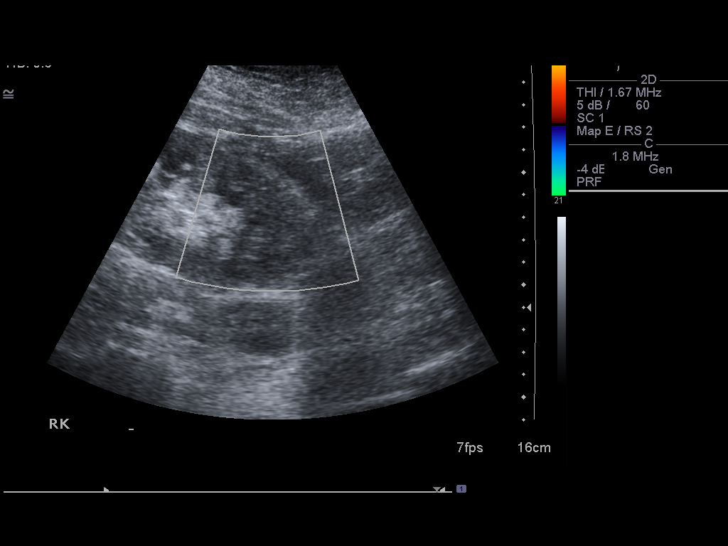

[11 of 11 positions shown; findings below may reference images not displayed]

FINDINGS: The images document guide needle placement within the
right renal cortex. Post biopsy images demonstrate no hemorrhage.
IMPRESSION: Successful ultrasound-guided random renal core biopsy.

## 2010-06-04 ENCOUNTER — Encounter: Payer: Self-pay | Admitting: Cardiovascular Disease

## 2010-06-21 ENCOUNTER — Encounter (HOSPITAL_COMMUNITY)
Admission: RE | Admit: 2010-06-21 | Discharge: 2010-09-19 | Payer: Self-pay | Source: Home / Self Care | Attending: Nephrology | Admitting: Nephrology

## 2010-09-27 ENCOUNTER — Encounter: Payer: Self-pay | Admitting: Cardiovascular Disease

## 2010-09-27 ENCOUNTER — Encounter (HOSPITAL_COMMUNITY)
Admission: RE | Admit: 2010-09-27 | Discharge: 2010-11-12 | Payer: Self-pay | Source: Home / Self Care | Attending: Nephrology | Admitting: Nephrology

## 2010-10-28 LAB — POCT HEMOGLOBIN-HEMACUE: Hemoglobin: 10.5 g/dL — ABNORMAL LOW (ref 12.0–15.0)

## 2010-11-12 NOTE — Letter (Signed)
Summary: [facility] Patient Note   [facility] Patient Note   Imported By: Roderic Ovens 06/26/2010 11:45:15  _____________________________________________________________________  External Attachment:    Type:   Image     Comment:   External Document

## 2010-11-12 NOTE — Letter (Signed)
Summary: [facility]  [facility]   Imported By: Kassie Mends 01/11/2010 09:37:51  _____________________________________________________________________  External Attachment:    Type:   Image     Comment:   External Document

## 2010-11-20 NOTE — Progress Notes (Signed)
Summary: [facility]: Office Visit  [facility]: Office Visit   Imported By: Earl Many 11/14/2010 18:25:37  _____________________________________________________________________  External Attachment:    Type:   Image     Comment:   External Document

## 2010-11-22 ENCOUNTER — Encounter (HOSPITAL_COMMUNITY): Payer: Medicare Other | Attending: Nephrology

## 2010-11-22 ENCOUNTER — Other Ambulatory Visit: Payer: Self-pay | Admitting: Nephrology

## 2010-11-22 DIAGNOSIS — N049 Nephrotic syndrome with unspecified morphologic changes: Secondary | ICD-10-CM | POA: Insufficient documentation

## 2010-11-22 DIAGNOSIS — N184 Chronic kidney disease, stage 4 (severe): Secondary | ICD-10-CM | POA: Insufficient documentation

## 2010-11-22 DIAGNOSIS — N052 Unspecified nephritic syndrome with diffuse membranous glomerulonephritis: Secondary | ICD-10-CM | POA: Insufficient documentation

## 2010-11-22 DIAGNOSIS — E876 Hypokalemia: Secondary | ICD-10-CM | POA: Insufficient documentation

## 2010-11-22 DIAGNOSIS — D638 Anemia in other chronic diseases classified elsewhere: Secondary | ICD-10-CM | POA: Insufficient documentation

## 2010-11-22 LAB — IRON AND TIBC
Iron: 66 ug/dL (ref 42–135)
Saturation Ratios: 32 % (ref 20–55)
TIBC: 209 ug/dL — ABNORMAL LOW (ref 250–470)
UIBC: 143 ug/dL

## 2010-11-22 LAB — RENAL FUNCTION PANEL
Albumin: 1.9 g/dL — ABNORMAL LOW (ref 3.5–5.2)
BUN: 27 mg/dL — ABNORMAL HIGH (ref 6–23)
CO2: 22 mEq/L (ref 19–32)
Calcium: 8.2 mg/dL — ABNORMAL LOW (ref 8.4–10.5)
Chloride: 112 mEq/L (ref 96–112)
Creatinine, Ser: 3.91 mg/dL — ABNORMAL HIGH (ref 0.4–1.2)
GFR calc Af Amer: 14 mL/min — ABNORMAL LOW (ref >60)
GFR calc non Af Amer: 11 mL/min — ABNORMAL LOW (ref >60)
Glucose, Bld: 89 mg/dL (ref 70–99)
Phosphorus: 4.3 mg/dL (ref 2.3–4.6)
Potassium: 3.9 mEq/L (ref 3.5–5.1)
Sodium: 138 mEq/L (ref 135–145)

## 2010-11-23 LAB — FERRITIN: Ferritin: 102 ng/mL (ref 10–291)

## 2010-11-25 LAB — POCT HEMOGLOBIN-HEMACUE: Hemoglobin: 11.1 g/dL — ABNORMAL LOW (ref 12.0–15.0)

## 2010-11-25 LAB — PTH, INTACT AND CALCIUM: PTH: 429.5 pg/mL — ABNORMAL HIGH (ref 14.0–72.0)

## 2010-12-20 ENCOUNTER — Encounter (HOSPITAL_COMMUNITY): Payer: Medicare Other | Attending: Nephrology

## 2010-12-20 ENCOUNTER — Other Ambulatory Visit: Payer: Self-pay

## 2010-12-20 ENCOUNTER — Other Ambulatory Visit: Payer: Self-pay | Admitting: Nephrology

## 2010-12-20 DIAGNOSIS — D638 Anemia in other chronic diseases classified elsewhere: Secondary | ICD-10-CM | POA: Insufficient documentation

## 2010-12-20 DIAGNOSIS — N184 Chronic kidney disease, stage 4 (severe): Secondary | ICD-10-CM | POA: Insufficient documentation

## 2010-12-20 DIAGNOSIS — N049 Nephrotic syndrome with unspecified morphologic changes: Secondary | ICD-10-CM | POA: Insufficient documentation

## 2010-12-20 LAB — IRON AND TIBC
Iron: 64 ug/dL (ref 42–135)
TIBC: 213 ug/dL — ABNORMAL LOW (ref 250–470)
UIBC: 149 ug/dL

## 2010-12-20 LAB — RENAL FUNCTION PANEL
BUN: 39 mg/dL — ABNORMAL HIGH (ref 6–23)
CO2: 22 mEq/L (ref 19–32)
Glucose, Bld: 126 mg/dL — ABNORMAL HIGH (ref 70–99)
Phosphorus: 4.6 mg/dL (ref 2.3–4.6)
Potassium: 3.8 mEq/L (ref 3.5–5.1)
Sodium: 139 mEq/L (ref 135–145)

## 2010-12-20 LAB — FERRITIN: Ferritin: 93 ng/mL (ref 10–291)

## 2010-12-23 LAB — RENAL FUNCTION PANEL
BUN: 34 mg/dL — ABNORMAL HIGH (ref 6–23)
Chloride: 115 mEq/L — ABNORMAL HIGH (ref 96–112)
Glucose, Bld: 97 mg/dL (ref 70–99)
Potassium: 3.3 mEq/L — ABNORMAL LOW (ref 3.5–5.1)
Sodium: 141 mEq/L (ref 135–145)

## 2010-12-23 LAB — POCT HEMOGLOBIN-HEMACUE
Hemoglobin: 11.6 g/dL — ABNORMAL LOW (ref 12.0–15.0)
Hemoglobin: 10.7 g/dL — ABNORMAL LOW (ref 12.0–15.0)

## 2010-12-24 LAB — RENAL FUNCTION PANEL
CO2: 20 mEq/L (ref 19–32)
Calcium: 7.8 mg/dL — ABNORMAL LOW (ref 8.4–10.5)
Chloride: 109 mEq/L (ref 96–112)
Creatinine, Ser: 4.53 mg/dL — ABNORMAL HIGH (ref 0.4–1.2)
Glucose, Bld: 105 mg/dL — ABNORMAL HIGH (ref 70–99)
Sodium: 136 mEq/L (ref 135–145)

## 2010-12-24 LAB — IRON AND TIBC
Iron: 22 ug/dL — ABNORMAL LOW (ref 42–135)
TIBC: 145 ug/dL — ABNORMAL LOW (ref 250–470)
UIBC: 123 ug/dL

## 2010-12-24 LAB — FERRITIN: Ferritin: 168 ng/mL (ref 10–291)

## 2010-12-24 LAB — POCT HEMOGLOBIN-HEMACUE: Hemoglobin: 11.3 g/dL — ABNORMAL LOW (ref 12.0–15.0)

## 2010-12-25 LAB — RENAL FUNCTION PANEL
BUN: 36 mg/dL — ABNORMAL HIGH (ref 6–23)
CO2: 21 mEq/L (ref 19–32)
Calcium: 8 mg/dL — ABNORMAL LOW (ref 8.4–10.5)
Chloride: 115 mEq/L — ABNORMAL HIGH (ref 96–112)
Creatinine, Ser: 4.05 mg/dL — ABNORMAL HIGH (ref 0.4–1.2)
GFR calc Af Amer: 13 mL/min — ABNORMAL LOW (ref >60)
GFR calc non Af Amer: 11 mL/min — ABNORMAL LOW (ref >60)
Glucose, Bld: 89 mg/dL (ref 70–99)

## 2010-12-25 LAB — IRON AND TIBC
Iron: 76 ug/dL (ref 42–135)
Saturation Ratios: 42 % (ref 20–55)
TIBC: 183 ug/dL — ABNORMAL LOW (ref 250–470)

## 2010-12-26 LAB — FERRITIN: Ferritin: 122 ng/mL (ref 10–291)

## 2010-12-26 LAB — RENAL FUNCTION PANEL
Albumin: 1.6 g/dL — ABNORMAL LOW (ref 3.5–5.2)
Calcium: 7.8 mg/dL — ABNORMAL LOW (ref 8.4–10.5)
GFR calc Af Amer: 14 mL/min — ABNORMAL LOW (ref >60)
GFR calc non Af Amer: 12 mL/min — ABNORMAL LOW (ref >60)
Phosphorus: 4 mg/dL (ref 2.3–4.6)
Potassium: 3.6 mEq/L (ref 3.5–5.1)
Sodium: 137 mEq/L (ref 135–145)

## 2010-12-26 LAB — POCT HEMOGLOBIN-HEMACUE
Hemoglobin: 11.8 g/dL — ABNORMAL LOW (ref 12.0–15.0)
Hemoglobin: 12.1 g/dL (ref 12.0–15.0)

## 2010-12-26 LAB — IRON AND TIBC
Saturation Ratios: 40 % (ref 20–55)
UIBC: 110 ug/dL

## 2010-12-27 ENCOUNTER — Encounter: Payer: Self-pay | Admitting: Cardiovascular Disease

## 2010-12-27 LAB — RENAL FUNCTION PANEL
Albumin: 1.6 g/dL — ABNORMAL LOW (ref 3.5–5.2)
BUN: 39 mg/dL — ABNORMAL HIGH (ref 6–23)
Chloride: 112 mEq/L (ref 96–112)
GFR calc Af Amer: 13 mL/min — ABNORMAL LOW (ref >60)
GFR calc non Af Amer: 11 mL/min — ABNORMAL LOW (ref >60)
Phosphorus: 4.6 mg/dL (ref 2.3–4.6)
Potassium: 3 mEq/L — ABNORMAL LOW (ref 3.5–5.1)

## 2010-12-27 LAB — PTH, INTACT AND CALCIUM
Calcium, Total (PTH): 7.9 mg/dL — ABNORMAL LOW (ref 8.4–10.5)
PTH: 220.7 pg/mL — ABNORMAL HIGH (ref 14.0–72.0)

## 2010-12-27 LAB — IRON AND TIBC: Saturation Ratios: 38 % (ref 20–55)

## 2010-12-28 LAB — POCT HEMOGLOBIN-HEMACUE: Hemoglobin: 12.3 g/dL (ref 12.0–15.0)

## 2010-12-29 LAB — POCT HEMOGLOBIN-HEMACUE
Hemoglobin: 11.5 g/dL — ABNORMAL LOW (ref 12.0–15.0)
Hemoglobin: 11.5 g/dL — ABNORMAL LOW (ref 12.0–15.0)

## 2010-12-29 LAB — RENAL FUNCTION PANEL
Calcium: 8.1 mg/dL — ABNORMAL LOW (ref 8.4–10.5)
Creatinine, Ser: 4.15 mg/dL — ABNORMAL HIGH (ref 0.4–1.2)
Glucose, Bld: <20 mg/dL — CL (ref 70–99)
Phosphorus: 10.7 mg/dL (ref 2.3–4.6)
Sodium: 140 mEq/L (ref 135–145)

## 2010-12-29 LAB — IRON AND TIBC
Iron: 79 ug/dL (ref 42–135)
Saturation Ratios: 61 % — ABNORMAL HIGH (ref 20–55)
UIBC: 71 ug/dL
UIBC: 81 ug/dL

## 2010-12-29 LAB — FERRITIN: Ferritin: 140 ng/mL (ref 10–291)

## 2010-12-30 LAB — POCT HEMOGLOBIN-HEMACUE
Hemoglobin: 11.6 g/dL — ABNORMAL LOW (ref 12.0–15.0)
Hemoglobin: 12.5 g/dL (ref 12.0–15.0)

## 2010-12-30 LAB — IRON AND TIBC
Iron: 70 ug/dL (ref 42–135)
UIBC: 113 ug/dL

## 2010-12-30 LAB — RENAL FUNCTION PANEL
BUN: 29 mg/dL — ABNORMAL HIGH (ref 6–23)
CO2: 21 mEq/L (ref 19–32)
Chloride: 109 mEq/L (ref 96–112)
Glucose, Bld: 108 mg/dL — ABNORMAL HIGH (ref 70–99)
Potassium: 3.1 mEq/L — ABNORMAL LOW (ref 3.5–5.1)
Sodium: 136 mEq/L (ref 135–145)

## 2010-12-31 LAB — POCT HEMOGLOBIN-HEMACUE: Hemoglobin: 11.6 g/dL — ABNORMAL LOW (ref 12.0–15.0)

## 2010-12-31 LAB — RENAL FUNCTION PANEL
Albumin: 1.5 g/dL — ABNORMAL LOW (ref 3.5–5.2)
BUN: 29 mg/dL — ABNORMAL HIGH (ref 6–23)
Creatinine, Ser: 3.47 mg/dL — ABNORMAL HIGH (ref 0.4–1.2)
Glucose, Bld: 86 mg/dL (ref 70–99)
Phosphorus: 4.3 mg/dL (ref 2.3–4.6)
Potassium: 3.3 mEq/L — ABNORMAL LOW (ref 3.5–5.1)

## 2010-12-31 LAB — IRON AND TIBC
Iron: 82 ug/dL (ref 42–135)
Saturation Ratios: 46 % (ref 20–55)
TIBC: 179 ug/dL — ABNORMAL LOW (ref 250–470)
UIBC: 97 ug/dL

## 2010-12-31 LAB — FERRITIN: Ferritin: 120 ng/mL (ref 10–291)

## 2011-01-01 LAB — POCT HEMOGLOBIN-HEMACUE: Hemoglobin: 11.5 g/dL — ABNORMAL LOW (ref 12.0–15.0)

## 2011-01-01 LAB — FERRITIN: Ferritin: 171 ng/mL (ref 10–291)

## 2011-01-01 LAB — IRON AND TIBC: TIBC: 142 ug/dL — ABNORMAL LOW (ref 250–470)

## 2011-01-05 LAB — IRON AND TIBC
Saturation Ratios: 32 % (ref 20–55)
TIBC: 180 ug/dL — ABNORMAL LOW (ref 250–470)

## 2011-01-05 LAB — POCT HEMOGLOBIN-HEMACUE: Hemoglobin: 11.2 g/dL — ABNORMAL LOW (ref 12.0–15.0)

## 2011-01-05 LAB — RENAL FUNCTION PANEL
CO2: 25 mEq/L (ref 19–32)
Chloride: 109 mEq/L (ref 96–112)
GFR calc Af Amer: 15 mL/min — ABNORMAL LOW (ref >60)
GFR calc non Af Amer: 12 mL/min — ABNORMAL LOW (ref >60)
Glucose, Bld: 118 mg/dL — ABNORMAL HIGH (ref 70–99)
Sodium: 138 mEq/L (ref 135–145)

## 2011-01-13 LAB — POCT HEMOGLOBIN-HEMACUE: Hemoglobin: 12.6 g/dL (ref 12.0–15.0)

## 2011-01-14 LAB — IRON AND TIBC
Saturation Ratios: 32 % (ref 20–55)
TIBC: 162 ug/dL — ABNORMAL LOW (ref 250–470)
UIBC: 110 ug/dL

## 2011-01-15 LAB — IRON AND TIBC
Iron: 68 ug/dL (ref 42–135)
Saturation Ratios: 50 % (ref 20–55)
TIBC: 137 ug/dL — ABNORMAL LOW (ref 250–470)

## 2011-01-15 LAB — FERRITIN: Ferritin: 97 ng/mL (ref 10–291)

## 2011-01-15 LAB — POCT HEMOGLOBIN-HEMACUE: Hemoglobin: 11.9 g/dL — ABNORMAL LOW (ref 12.0–15.0)

## 2011-01-16 LAB — IRON AND TIBC
Saturation Ratios: 27 % (ref 20–55)
UIBC: 83 ug/dL

## 2011-01-16 LAB — POCT HEMOGLOBIN-HEMACUE: Hemoglobin: 11.8 g/dL — ABNORMAL LOW (ref 12.0–15.0)

## 2011-01-16 LAB — FERRITIN: Ferritin: 114 ng/mL (ref 10–291)

## 2011-01-17 ENCOUNTER — Other Ambulatory Visit: Payer: Self-pay | Admitting: Nephrology

## 2011-01-17 ENCOUNTER — Encounter (HOSPITAL_COMMUNITY): Payer: Medicare Other | Attending: Nephrology

## 2011-01-17 DIAGNOSIS — D638 Anemia in other chronic diseases classified elsewhere: Secondary | ICD-10-CM | POA: Insufficient documentation

## 2011-01-17 DIAGNOSIS — N184 Chronic kidney disease, stage 4 (severe): Secondary | ICD-10-CM | POA: Insufficient documentation

## 2011-01-17 DIAGNOSIS — N049 Nephrotic syndrome with unspecified morphologic changes: Secondary | ICD-10-CM | POA: Insufficient documentation

## 2011-01-17 LAB — RENAL FUNCTION PANEL
Albumin: 1.9 g/dL — ABNORMAL LOW (ref 3.5–5.2)
BUN: 34 mg/dL — ABNORMAL HIGH (ref 6–23)
CO2: 21 mEq/L (ref 19–32)
Calcium: 7.8 mg/dL — ABNORMAL LOW (ref 8.4–10.5)
Chloride: 115 mEq/L — ABNORMAL HIGH (ref 96–112)
Creatinine, Ser: 4.59 mg/dL — ABNORMAL HIGH (ref 0.4–1.2)
GFR calc Af Amer: 11 mL/min — ABNORMAL LOW (ref >60)
GFR calc non Af Amer: 9 mL/min — ABNORMAL LOW (ref >60)

## 2011-01-17 LAB — POCT HEMOGLOBIN-HEMACUE: Hemoglobin: 12 g/dL (ref 12.0–15.0)

## 2011-01-17 LAB — IRON AND TIBC
Iron: 61 ug/dL (ref 42–135)
Saturation Ratios: 38 % (ref 20–55)
TIBC: 198 ug/dL — ABNORMAL LOW (ref 250–470)
TIBC: 143 ug/dL — ABNORMAL LOW (ref 250–470)

## 2011-01-17 LAB — FERRITIN: Ferritin: 151 ng/mL (ref 10–291)

## 2011-01-18 LAB — IRON AND TIBC
Iron: 47 ug/dL (ref 42–135)
Saturation Ratios: 38 % (ref 20–55)
TIBC: 124 ug/dL — ABNORMAL LOW (ref 250–470)
UIBC: 77 ug/dL

## 2011-01-18 LAB — FERRITIN: Ferritin: 96 ng/mL (ref 10–291)

## 2011-01-18 LAB — POCT HEMOGLOBIN-HEMACUE: Hemoglobin: 12.7 g/dL (ref 12.0–15.0)

## 2011-01-19 LAB — IRON AND TIBC
Saturation Ratios: 41 % (ref 20–55)
UIBC: 82 ug/dL

## 2011-01-19 LAB — POCT HEMOGLOBIN-HEMACUE: Hemoglobin: 10.7 g/dL — ABNORMAL LOW (ref 12.0–15.0)

## 2011-01-19 LAB — FERRITIN: Ferritin: 133 ng/mL (ref 10–291)

## 2011-01-20 LAB — COMPREHENSIVE METABOLIC PANEL
AST: 22 U/L (ref 0–37)
Alkaline Phosphatase: 65 U/L (ref 39–117)
BUN: 26 mg/dL — ABNORMAL HIGH (ref 6–23)
CO2: 26 mEq/L (ref 19–32)
Chloride: 109 mEq/L (ref 96–112)
Creatinine, Ser: 2.46 mg/dL — ABNORMAL HIGH (ref 0.4–1.2)
GFR calc Af Amer: 23 mL/min — ABNORMAL LOW (ref >60)
GFR calc non Af Amer: 19 mL/min — ABNORMAL LOW (ref >60)
Potassium: 3.4 mEq/L — ABNORMAL LOW (ref 3.5–5.1)
Total Bilirubin: 0.4 mg/dL (ref 0.3–1.2)

## 2011-01-20 LAB — POCT HEMOGLOBIN-HEMACUE: Hemoglobin: 12.1 g/dL (ref 12.0–15.0)

## 2011-01-20 LAB — IRON AND TIBC: Saturation Ratios: 35 % (ref 20–55)

## 2011-01-20 LAB — FERRITIN: Ferritin: 104 ng/mL (ref 10–291)

## 2011-01-21 LAB — POCT HEMOGLOBIN-HEMACUE
Hemoglobin: 11.3 g/dL — ABNORMAL LOW (ref 12.0–15.0)
Hemoglobin: 11.1 g/dL — ABNORMAL LOW (ref 12.0–15.0)

## 2011-01-22 LAB — RENAL FUNCTION PANEL
Albumin: <1 g/dL — ABNORMAL LOW (ref 3.5–5.2)
Albumin: <1 g/dL — ABNORMAL LOW (ref 3.5–5.2)
BUN: 27 mg/dL — ABNORMAL HIGH (ref 6–23)
CO2: 26 mEq/L (ref 19–32)
Calcium: 7.5 mg/dL — ABNORMAL LOW (ref 8.4–10.5)
Chloride: 111 mEq/L (ref 96–112)
Creatinine, Ser: 2.16 mg/dL — ABNORMAL HIGH (ref 0.4–1.2)
GFR calc Af Amer: 27 mL/min — ABNORMAL LOW (ref >60)
GFR calc Af Amer: 27 mL/min — ABNORMAL LOW (ref >60)
GFR calc non Af Amer: 22 mL/min — ABNORMAL LOW (ref >60)
GFR calc non Af Amer: 22 mL/min — ABNORMAL LOW (ref >60)
Phosphorus: 3.8 mg/dL (ref 2.3–4.6)
Phosphorus: 3.9 mg/dL (ref 2.3–4.6)
Potassium: 3.9 mEq/L (ref 3.5–5.1)
Sodium: 137 mEq/L (ref 135–145)

## 2011-01-22 LAB — HEMOGLOBIN AND HEMATOCRIT, BLOOD
HCT: 36.9 % (ref 36.0–46.0)
Hemoglobin: 11.4 g/dL — ABNORMAL LOW (ref 12.0–15.0)

## 2011-01-22 LAB — CBC
MCHC: 33 g/dL (ref 30.0–36.0)
MCV: 88.6 fL (ref 78.0–100.0)
Platelets: 181 10*3/uL (ref 150–400)
RBC: 3.65 MIL/uL — ABNORMAL LOW (ref 3.87–5.11)
RDW: 16.1 % — ABNORMAL HIGH (ref 11.5–15.5)

## 2011-01-22 LAB — IRON AND TIBC: Iron: 24 ug/dL — ABNORMAL LOW (ref 42–135)

## 2011-01-23 LAB — CBC
MCHC: 33 g/dL (ref 30.0–36.0)
MCV: 92.3 fL (ref 78.0–100.0)
Platelets: 243 10*3/uL (ref 150–400)
RBC: 3.81 MIL/uL — ABNORMAL LOW (ref 3.87–5.11)
WBC: 6.6 10*3/uL (ref 4.0–10.5)

## 2011-01-23 LAB — IRON AND TIBC
Iron: 23 ug/dL — ABNORMAL LOW (ref 42–135)
Saturation Ratios: 24 % (ref 20–55)
UIBC: 71 ug/dL

## 2011-01-23 LAB — POCT HEMOGLOBIN-HEMACUE
Hemoglobin: 11.2 g/dL — ABNORMAL LOW (ref 12.0–15.0)
Hemoglobin: 10.7 g/dL — ABNORMAL LOW (ref 12.0–15.0)

## 2011-01-27 LAB — CBC
HCT: 29.5 % — ABNORMAL LOW (ref 36.0–46.0)
Hemoglobin: 9.8 g/dL — ABNORMAL LOW (ref 12.0–15.0)
WBC: 8.5 10*3/uL (ref 4.0–10.5)

## 2011-01-27 LAB — PROTEIN ELECTROPH W RFLX QUANT IMMUNOGLOBULINS
Beta Globulin: 4.7 % (ref 4.7–7.2)
Gamma Globulin: 16.3 % (ref 11.1–18.8)
M-Spike, %: NOT DETECTED g/dL
Total Protein ELP: 4.6 g/dL — ABNORMAL LOW (ref 6.0–8.3)

## 2011-01-27 LAB — URINALYSIS, MICROSCOPIC ONLY
Bilirubin Urine: NEGATIVE
Leukocytes, UA: NEGATIVE
Nitrite: NEGATIVE
Specific Gravity, Urine: 1.024 (ref 1.005–1.030)
pH: 6 (ref 5.0–8.0)

## 2011-01-27 LAB — ANA: Anti Nuclear Antibody(ANA): POSITIVE — AB

## 2011-01-27 LAB — ANTI-NUCLEAR AB-TITER (ANA TITER): ANA Titer 1: 1:160 {titer} — ABNORMAL HIGH

## 2011-01-27 LAB — UIFE/LIGHT CHAINS/TP QN, 24-HR UR
Albumin, U: DETECTED
Beta, Urine: DETECTED — AB
Free Lambda Lt Chains,Ur: 15.2 mg/dL — ABNORMAL HIGH (ref 0.08–1.01)
Gamma Globulin, Urine: DETECTED — AB
Total Protein, Urine-Ur/day: 7145 mg/d — ABNORMAL HIGH (ref 10–140)

## 2011-01-27 LAB — ANTI-NEUTROPHIL ANTIBODY

## 2011-01-27 LAB — POCT I-STAT 3, ART BLOOD GAS (G3+)
Acid-base deficit: 2 mmol/L (ref 0.0–2.0)
Bicarbonate: 23.2 mEq/L (ref 20.0–24.0)
Bicarbonate: 23.5 mEq/L (ref 20.0–24.0)
O2 Saturation: 92 %
TCO2: 25 mmol/L (ref 0–100)
pCO2 arterial: 42 mmHg (ref 35.0–45.0)
pH, Arterial: 7.355 (ref 7.350–7.400)
pO2, Arterial: 36 mmHg — CL (ref 80.0–100.0)
pO2, Arterial: 65 mmHg — ABNORMAL LOW (ref 80.0–100.0)

## 2011-01-27 LAB — HEPATIC FUNCTION PANEL
Albumin: <1 g/dL — ABNORMAL LOW (ref 3.5–5.2)
Alkaline Phosphatase: 77 U/L (ref 39–117)
Total Bilirubin: 0.4 mg/dL (ref 0.3–1.2)

## 2011-01-27 LAB — BASIC METABOLIC PANEL
CO2: 28 mEq/L (ref 19–32)
Chloride: 109 mEq/L (ref 96–112)
GFR calc Af Amer: 31 mL/min — ABNORMAL LOW (ref >60)
Potassium: 3.6 mEq/L (ref 3.5–5.1)

## 2011-01-27 LAB — IGG, IGA, IGM
IgA: 553 mg/dL — ABNORMAL HIGH (ref 68–378)
IgM, Serum: 170 mg/dL (ref 60–263)

## 2011-01-27 LAB — AMIODARONE LEVEL: N-Desethyl-Amiodarone: <0.1 ug/mL (ref 0.25–3.50)

## 2011-01-28 LAB — RENAL FUNCTION PANEL
Calcium: 7.9 mg/dL — ABNORMAL LOW (ref 8.4–10.5)
Creatinine, Ser: 2.05 mg/dL — ABNORMAL HIGH (ref 0.4–1.2)
Glucose, Bld: 85 mg/dL (ref 70–99)
Phosphorus: 3.6 mg/dL (ref 2.3–4.6)
Sodium: 138 mEq/L (ref 135–145)

## 2011-01-28 LAB — POCT HEMOGLOBIN-HEMACUE: Hemoglobin: 7.5 g/dL — CL (ref 12.0–15.0)

## 2011-01-28 LAB — APTT: aPTT: 33 seconds (ref 24–37)

## 2011-01-28 LAB — CBC
HCT: 27.6 % — ABNORMAL LOW (ref 36.0–46.0)
Hemoglobin: 9.6 g/dL — ABNORMAL LOW (ref 12.0–15.0)
Platelets: 264 10*3/uL (ref 150–400)
WBC: 8.7 10*3/uL (ref 4.0–10.5)

## 2011-01-28 LAB — PROTIME-INR: INR: 1 (ref 0.00–1.49)

## 2011-02-14 ENCOUNTER — Other Ambulatory Visit: Payer: Self-pay | Admitting: Nephrology

## 2011-02-14 ENCOUNTER — Encounter (HOSPITAL_COMMUNITY)
Admission: RE | Admit: 2011-02-14 | Discharge: 2011-02-14 | Disposition: A | Discharge status: Home / Self Care | Payer: Medicare Other | Source: Ambulatory Visit | Attending: Nephrology | Admitting: Nephrology

## 2011-02-14 DIAGNOSIS — D638 Anemia in other chronic diseases classified elsewhere: Secondary | ICD-10-CM | POA: Insufficient documentation

## 2011-02-14 DIAGNOSIS — N049 Nephrotic syndrome with unspecified morphologic changes: Secondary | ICD-10-CM | POA: Insufficient documentation

## 2011-02-14 DIAGNOSIS — N184 Chronic kidney disease, stage 4 (severe): Secondary | ICD-10-CM | POA: Insufficient documentation

## 2011-02-14 LAB — RENAL FUNCTION PANEL
BUN: 34 mg/dL — ABNORMAL HIGH (ref 6–23)
CO2: 19 mEq/L (ref 19–32)
Glucose, Bld: 105 mg/dL — ABNORMAL HIGH (ref 70–99)
Phosphorus: 4 mg/dL (ref 2.3–4.6)
Potassium: 3.6 mEq/L (ref 3.5–5.1)

## 2011-02-14 LAB — IRON AND TIBC
Iron: 108 ug/dL (ref 42–135)
Saturation Ratios: 50 % (ref 20–55)
TIBC: 214 ug/dL — ABNORMAL LOW (ref 250–470)
UIBC: 106 ug/dL

## 2011-02-14 LAB — FERRITIN: Ferritin: 106 ng/mL (ref 10–291)

## 2011-02-17 LAB — POCT HEMOGLOBIN-HEMACUE: Hemoglobin: 11.4 g/dL — ABNORMAL LOW (ref 12.0–15.0)

## 2011-02-25 NOTE — Discharge Summary (Signed)
NAME:  Sherri [name], Sherri [name]                  ACCOUNT NO.:  [id number]   MEDICAL RECORD NO.:  [id number]          PATIENT TYPE:  INP   LOCATION:  3705                         FACILITY:  [facility]   PHYSICIAN:  Veverly Fells. Excell Seltzer, MD  DATE OF BIRTH:  [birth year]   DATE OF ADMISSION:  10/26/2008  DATE OF DISCHARGE:  10/27/2008                               DISCHARGE SUMMARY   PRIMARY CARDIOLOGIST:  Veverly Fells. Excell Seltzer, MD   NEPHROLOGIST:  Garnetta Buddy, MD   PROCEDURES PERFORMED DURING HOSPITALIZATION:  Right and left cardiac  catheterization on October 26, 2008.  A.  Sherri [name]'s massive edema appears unrelated to congestive heart  failure as her intracardiac pressures are in the low normal range.  I am  concerned that she may have nephrotic syndrome.   FINAL DISCHARGE DIAGNOSES:  1. Nephrotic syndrome.  2. Anasarca.  3. Chronic kidney disease stage III.  4. Hyponatremia.  5. History of hyperkalemia.  6. History of hypotension.  7. Chronic venous stasis of the lower extremities with calf wounds.  8. History of pulmonary nodule 6 mm by CT scan in the anterior right      upper lobe slightly larger there.  Follow up recommended every      months.  9. Osteoarthritis.  10.Hard of hearing  11.Severe lower extremity edema with chronic stasis changes and venous      ulcers.  12.Hyperlipidemia.  13.Iron-deficiency anemia.  14.Ongoing tobacco abuse.  15.Coronary artery disease.      a.     Most recent catheterization to evaluate coronary arteries       July 2009, LAD 75%, diagonal 75%, circumflex 85%, right coronary       artery 80%, posterior descending artery 70% managed medically.   HOSPITAL COURSE:  This is a 77 year old Caucasian female with a history  of diastolic heart failure who had been hospitalized frequently  secondary to severe volume overload and hyponatremia.  Her edema was  found to be multifactorial and she was found to have hypoalbuminuria.  The patient complained of exertional  dyspnea but denied orthopnea or  PND.  She was seen in the office by Dr. Tonny Bollman on October 11, 2008 and the patient was given aggressive diuresis using Lasix and  metolazone however, secondary to that the patient had hospitalization  and developed renal insufficiency prior to following with Dr. Excell Seltzer  most recently.  The patient was admitted for cardiac catheterization  secondary to these above-mentioned symptoms and syndromes and was  scheduled for a right heart cath.   The patient did have right heart cath on October 26, 2008 was found to  have edema unrelated to heart failure with pressures within low normal  range.  Dr. Excell Seltzer was concerned that the patient may have nephrotic  syndrome and a 24-hour urine protein was started with several serologies  to check for amyloid.  In the interim, Dr. Elvis Coil was consulted  concerning this.  The patient was seen and examined by Dr. Hyman Hopes on  October 26, 2008 with followup labs pending.   The patient was  seen and examined by Dr. Excell Seltzer on October 27, 2008 and  found to be stable.  The patient still had lower extremity edema 3+ in  her thighs.  Her urine protein revealed greater than 300 and 11-20 red  blood cells.  The chest x-ray showed findings consistent with COPD.  The  24-hour urine collection was completed and serologies are pending.  The  patient will follow up with Dr. Hyman Hopes in clinic with a previously  scheduled appointment made by his colleague for October 30, 2008.  She  will also follow up with Dr. Excell Seltzer in 1 month for continuing cardiac  management.   LABORATORY DATA ON DISCHARGE:  Immunofixation electrophoresis total  protein 4.4, IgA 537, IgG 696, IgM 159, HIV nonreactive.  BMET sodium  141, potassium 3.6, chloride 109, CO2 28, glucose 100, BUN 24,  creatinine 1.9, hemoglobin 9.8, hematocrit 29.5, white blood cells 8.5,  platelets 262, bilirubin 0.3, direct bilirubin 0.1, alkaline phosphatase  78, SGOT 22, total  protein 4.7, albumin 1.0, ALT 13.  Chest x-ray dated  October 26, 2008, COPD, cardiomegaly with tortuous aorta, very small  bilateral pleural effusions are suspected.   DISCHARGE VITAL SIGNS:  Blood pressure 118/79, pulse 78, respirations  18, temperature 98.3, O2 sat 97% on 2 L.   DISCHARGE MEDICATIONS:  1. Metoprolol 25 mg b.i.d.  2. Aspirin 81 mg daily.  3. Furosemide 80 mg b.i.d.  4. Klor-Con 10 mEq daily.  5. Poly-iron daily.  6. Nitrostat p.r.n.   ALLERGIES:  No known drug allergies.   FOLLOWUP PLANS AND APPOINTMENTS:  1. The patient is to follow up with Dr. Elvis Coil, Nephrology on      October 30, 2008 at 8:30 a.m.  2. The patient is to follow up with Dr. Tonny Bollman on November 10, 2008 at 1:45 p.m.  3. The patient is given post cardiac catheterization instructions with      particular emphasis on the right groin site for evidence of      bleeding, hematoma or signs of infection.   Time spent with the patient to include physician time 35 minutes.      Bettey Mare. Lyman Bishop, NP      Veverly Fells. Excell Seltzer, MD  Electronically Signed    KML/MEDQ  D:  10/27/2008  T:  10/28/2008  Job:  [id number]   cc:   Garnetta Buddy, M.D.

## 2011-02-25 NOTE — Consult Note (Signed)
NAME:  Sherri [name], Sherri [name] NO.:  [id number]   MEDICAL RECORD NO.:  [id number]          PATIENT TYPE:  INP   LOCATION:  2921                         FACILITY:  [facility]   PHYSICIAN:  Garnetta Buddy, M.D.   DATE OF BIRTH:  May 21, 1932   DATE OF CONSULTATION:  09/11/2008  DATE OF DISCHARGE:                                 CONSULTATION   HISTORY OF PRESENT ILLNESS:  This is a consultation for acute renal  failure with the serum creatinine increasing to 0.9, baseline is 1.7.  Admission on September 06, 2008, with chronic congestive heart failure,  hyponatremia, hypokalemia, hypotension with the blood pressure of 90,  and chronic venostasis of the left calf with wound.  Serum sodium 113,  given conivaptan.  Workup revealed pulmonary nodule.  She had a urine  sodium, urine osmolality of 168, and serum osmolality of 251.   PAST MEDICAL HISTORY:  1. Diastolic dysfunction.  2. History of tobacco use.  3. History of anemia.  4. History of hyperlipidemia.  5. History of non-ST segment elevated MI with cardiac catheterization      in June 2009.   MEDICATIONS:  1. Vasotec, on hold.  2. Low molecular weight heparin.  3. Lasix 40 mg IV t.i.d.  4. Iron 150 mg b.i.d.  5. Lopressor 25 mg b.i.d.   ALLERGIES:  No known drug allergies.   SOCIAL HISTORY:  A 60 pack year smoker.  She lives with her son in [place].  She is single.  No alcohol.   FAMILY HISTORY:  Positive for myocardial infarction.   REVIEW OF SYSTEMS:  Positive for weakness and fatigue.  No fever,  sweats, or chills.  EYES:  No visual loss, diplopia; no loss of vision.  EARS, NOSE, MOUTH, and THROAT:  Mild hearing loss.  No swallowing  difficulty.  No sinusitis or further dental complaints.  CARDIOVASCULAR:  Bilateral lower extremity swelling.  History of cardiac catheterization  in June 2009.  Three-vessel disease.  No shortness of breath.  No chest  pain.  No orthopnea.  No syncope.  RESPIRATORY:  Positive for  cough,  nonproductive and non-blood tinged.  ABDOMINAL SYSTEM:  Appetite is  fair.  No nausea, vomiting, or diarrhea.  NEUROLOGIC:  No diplopia,  dysarthria, dysphagia, paresthesias, or unilateral upper and lower  extremity weakness.  UROGENITAL:  Negative for dysuria, hematuria, or  renal calculi.  No frothy or foaminess in urine.  MUSCULOSKELETAL:  No  __________.  No history of gout.  DERMATOLOGY:  No itching.  No rash.  ENDOCRINE:  No history of thyroid or adrenal disorder.   PHYSICAL EXAMINATION:  GENERAL:  Alert and awake pleasant lady, eating  lunch.  She was in no distress.  She was alert and oriented x3.  VITAL SIGNS:  Blood pressure 117/74, pulse 76, temperature 98.3,  saturation is 99% on room air.  In's and out's negative for 843 with  diuresis of 1159 in and 2001 out.  HEAD AND EYES:  Normocephalic and atraumatic.  Pupils are round, equal,  and reactive to light.  Extraocular  movements are intact.  No icterus.  No pallor.  EARS, NOSE, MOUTH, AND THROAT:  __________ Nasal mucosa clear.  Oropharynx is clear.  Moist mucous membranes.  NECK:  Supple.  JVP mildly elevated.  No lymphadenopathy.  No  thyromegaly.  CARDIOVASCULAR:  Regular rate and rhythm.  No murmurs, rubs, or gallops.  RESPIRATORY:  Lung fields are clear to auscultation.  No wheezes or  rales.  ABDOMEN:  Soft, nontender, and nondistended.  Bowel sounds were active.  UROGENITAL:  Woody, hard, chronic stasis changes mostly on the left and  also on the right.  Peripheral pulses were difficult to appreciate.  No  ischemic changes were noted from the lower extremity digits.  No  atheroemboli.   LABORATORY DATA:  Occult blood was positive with WBC 6.3, hemoglobin  8.8, and platelets of 221.  Sodium 139, potassium 4, chloride 101, CO2  is 22, BUN 30, creatinine 1.7, glucose of 195, and calcium 9.5.  Ultrasound of renal was negative.  Chest x-ray revealed a pulmonary  nodule and left-sided pleural effusion.  TSH was  3.1.   ASSESSMENT AND PLAN:  1. Acute renal failure, most likely due to ACE inhibitor with volume      depletion.  Hypotension would anticipate return of renal function,      probably acute tubular necrosis.  No nephrotoxic insults      identifiable.  2. Hyponatremia.  I agree with workup for hypoadrenalism.  Urine      osmolality was less than 168, which is very appropriate for a serum      osmolality of 251; however, this may be induced by conivaptan.  I      otherwise would agree with CT scan of chest to lookout for      pulmonary nodule, pulmonary consult, possible HCT, cortisol level,      __________ to increase with IV diuretic therapy.      Garnetta Buddy, M.D.  Electronically Signed     MWW/MEDQ  D:  09/11/2008  T:  09/12/2008  Job:  [id number]   cc:   Veverly Fells. Excell Seltzer, MD

## 2011-02-25 NOTE — Assessment & Plan Note (Signed)
[facility]                            CARDIOLOGY OFFICE NOTE   LAYKIN, RAINONE                           MRN:          [id number]  DATE:07/19/2008                            DOB:          03/13/1932    PRIMARY CARDIOLOGIST:  Veverly Fells. Excell Seltzer, MD   Sherri [name] is a very pleasant 77 year old [place] American female patient  who I saw last week in the office with heart failure and adjusted her  medications.  She is here for followup.  Overall, she has a only loss 1  pound, but says her counts are much titer and she feels like her fluid  is down and she is feeling better.  She has a history of a non-ST-  elevation MI and congestive heart failure in July 2009.  She underwent  diuresis followed by cardiac cath which showed an ejection fraction of  65% with no valvular abnormalities.  She had 75% LAD, 75% diagonal, 85%  circumflex, 80% RCA and 70% PDA.  Consideration was given to bypass  surgery, but the patient chose not to have this and medical therapy was  recommended.   When I saw her last week, she had gained 28 pounds in the past month and  complaining of increased shortness of breath with walking she denies any  chest pain, palpitations, dizziness or presyncope.  I did do blood work  last week and her BNP was elevated at 309.  Her sodium was low 126,  potassium 4.3, BUN 28, and creatinine 1.2.  She is also mildly anemic  and has been on iron.  We did do a urinalysis and she had a moderate  amount of blood and greater than 300 protein but culture was not  performed.  The patient continues to monitor her sodium intake.   I did order a 2-D echo which has not been formally read, but Alona Bene said  her LV function look good.  There was some mild pulmonary hypertension  and small amount of AI, MR and TR.   CURRENT MEDICATIONS:  1. Lasix 40 mg b.i.d.  2. Ferrex 150 mg b.i.d.  3. Enalapril 2.5 mg b.i.d.  4. Simvastatin 40 mg daily.  5. Metoprolol 25 mg  b.i.d.   PHYSICAL EXAMINATION:  GENERAL:  This is a very pleasant 77 year old  African American female in no acute distress.  VITAL SIGNS:  Blood pressure is 153/79, pulse 59, weight 180 only down 1  pound from last week.  NECK:  Without JVD, HJR, bruit or thyroid enlargement.  LUNGS:  Clear anterior, posterior and lateral.  HEART:  Regular rate and rhythm at 60 beats per minute.  Normal S1 and  S2.  Positive S4.  No murmur, rub, bruit, thrill or heave noted.  ABDOMEN:  Soft without organomegaly, masses, lesions or abnormal  tenderness.  EXTREMITIES:  She has +2 to 3 edema all the way up to her hips but they  are not distended.  There were last week   IMPRESSION:  1. Acute on chronic diastolic dysfunction, question etiology.  2. Status post non-ST elevation  myocardial infarction in July 2009      with congestive heart failure, resolved.  3. Three-vessel coronary artery disease with normal left ventricular      function.  Medical therapy recommended after the patient declined      bypass surgery.  4. Tobacco abuse, quit.  5. Hyponatremia.  6. Anemia.  Iron deficiency anemia.  7. Family history of coronary artery disease.  8. Hyperlipidemia.   PLAN:  The patient still has quite a bit of fluid on board but is  feeling a little bit better.  I have gone back up on her Lasix to 80 mg  in the morning and 40 in the evening.  I am repeating BMET and BNP today  to make sure her sodium has not dropped at all and we will have her see  Dr. Excell Seltzer back next week in followup.      Jacolyn Reedy, PA-C  Electronically Signed      Doylene Canning. Ladona Ridgel, MD  Electronically Signed   ML/MedQ  DD: 07/19/2008  DT: 07/20/2008  Job #: [id number]

## 2011-02-25 NOTE — Discharge Summary (Signed)
NAME:  Sherri [name], Sherri [name]                  ACCOUNT NO.:  [id number]   MEDICAL RECORD NO.:  [id number]          PATIENT TYPE:  INP   LOCATION:  3707                         FACILITY:  [facility]   PHYSICIAN:  Pricilla Riffle, MD, FACCDATE OF BIRTH:  Feb 22, 1932   DATE OF ADMISSION:  04/20/2008  DATE OF DISCHARGE:  04/27/2008                               DISCHARGE SUMMARY   PROCEDURES:  1. Cardiac catheterization.  2. Right heart rate cardiac catheterization.  3. Left ventriculogram.  4. Coronary angiogram.  5. 2-D echocardiogram.  6. CT angiogram of the chest.   PRIMARY FINAL DISCHARGE DIAGNOSIS:  Non-ST segment elevation myocardial  infarction.   SECONDARY DIAGNOSES:  1. Arthritis.  2. Tobacco use.  3. Family history of coronary artery disease in both parents and      siblings.  4. Acute diastolic congestive heart failure.  5. Anemia with a hemoglobin of 10.7, and hematocrit of 31.4 at      discharge, 11.4 at 33.0 on admission.  6. Hyperlipidemia with a total cholesterol of 233, triglycerides 109,      HDL 40, LDL 171, this admission.  7. Iron deficiency with a normal B12 and ferritin seen on labs.   TIME OF DISCHARGE:  35 minutes.   HOSPITAL COURSE:  Sherri [name] is a 77 year old female with no previous  history of coronary artery disease.  She came to the hospital for  swelling in her lower extremities as well as epigastric discomfort and  dyspnea on exertion.  Her cardiac enzymes were elevated and her chest x-  ray showed pleural effusions.  She was admitted for further evaluation.   The peak on her cardiac enzymes with an MB of 9.8 on point care markers  and a peak troponin of 1.58.  Her BNP was 1090.  She was diuresed with  IV Lasix and her respiratory status improved.  By discharge, her O2  saturation was 97% on room air.  As her respiratory status improved, she  was transitioned to p.o. Lasix.  She was started on a beta blocker and  potassium supplementation.  She was started on  aspirin, but this was  decreased to 81 mg daily.   A CT angiogram of the chest was performed because of the chest pain.  She had bilateral small pleural effusions and a small amount of  pericardial fluid, but no pulmonary embolism.  She had emphysematous  disease with indeterminate small nodular densities and 6 months follow  up is recommended.   As her volume overload improved, cardiac catheterization was performed  and it showed an EF of 65% with no significant valvular abnormalities.  She had a 75% LAD, 75% diagonal, 85% circumflex, 70 and 80% RCA, 70%  PDA.  Her right heart hemodynamics were normal.  Percutaneous  intervention was not a good option because of the location of the  lesions and the sizes of this vessel involved.  Consideration was given  to bypass surgery and this was discussed with the patient her sister,  but the patient opted against that.  Medical therapy was recommended.  A  smoking cessation consult was called and the patient was strongly  encouraged to quit.  She was continued on aspirin and started on the  beta blocker as well as a statin.  She was tolerating the medications  well and her blood pressure which had been 182/107 on admission was much  better control.   Because of the anemia, an iron profile was done and showed some iron  deficiency.  She is to take supplementation and encouraged to obtain a  primary care physician.   By April 27, 2008, Sherri [name] was ambulating without chest pain and  shortness of breath.  Her weight was 77.5 kg.  She had some slight  pretibial edema, but this was strongly improved.  Dr. Tenny Craw felt that Ms.  Depinto could safely be discharged home with outpatient follow up arranged.   DISCHARGE INSTRUCTIONS:  1. Her activity level is to be increased gradually with no driving for      2 days and no lifting for 2 weeks.  2. She is to stick to a low-sodium and fat diet and information will      be given to her on this prior to  discharge.  3. She is to call our office for any problems with the cath site.  She      is to follow up with Dr. Antoine Poche on May 15, 2008, at 10:45.   DISCHARGE MEDICATIONS:  1. Simvastatin 40 mg daily p.m.  2. Enalapril 2.5 mg b.i.d.  3. Aspirin 81 mg a day.  4. Furosemide 40 mg a day.  5. K-Dur 20 mEq a day.  6. Metoprolol 1 tab b.i.d.  7. Nitroglycerin sublingual p.r.n.  8. Nu-Iron 150 mg b.i.d.   She is to get a BMET in 1 week and prefers to get it done at our office,  so this has been arranged.      Theodore Demark, PA-C      Pricilla Riffle, MD, [facility]  Electronically Signed    RB/MEDQ  D:  04/27/2008  T:  04/28/2008  Job:  [id number]   cc:   Rollene Rotunda, MD, [facility]

## 2011-02-25 NOTE — Assessment & Plan Note (Signed)
[facility]                          CHRONIC HEART FAILURE NOTE   CHRISTIAN, TREADWAY                           MRN:          [id number]  DATE:09/21/2008                            DOB:          September 18, 1932    PRIMARY CARDIOLOGIST:  Veverly Fells. Excell Seltzer, MD   Ms. Ayotte is new to the Heart Failure Clinic.  She is a delightful 77-  year-old [place] American female followed by Dr. Tonny Bollman with  history of congestive heart failure secondary to diastolic dysfunction,  status post recent hospitalization for the same.  Ms. Rickerson was  discharged home on September 14, 2008, and is here today for her first CHF  visit.  She currently does not have a primary care physician.  She lives  alone and has a sister, Wayland Salinas who is power of attorney and  accompanies her today.  Ms. Kilburg has had problems with lower extremity  leg ulcers being followed by home health.  During recent  hospitalization, Ms. Lococo was diuresed with IV Lasix and then able to  resume her p.o. medication, but because of worsening creatinine, she was  cut back to 80 mg once a day.  Renal was also asked to consult.  She was  seen by Dr. Hyman Hopes.  He felt that this was most likely ATN.  Ms. Grist  ACE inhibitor was held because of her worsening renal function.  At time  of discharge, she was taking Lasix 80 mg daily.  Since returning home,  Ms. Helf states she has been diligent in following a 1500 mL fluid  restriction and she does not want to be hospitalized again.  She has  brought a list today of the things that she is eating and drinking, and  amazingly enough, is following a 1500 mL fluid restriction in regards to  her written intake.  Home health nurses are still dressing her leg  ulcers.  Otherwise, she denies any orthopnea or PND.  States her lower  extremity edema is stable at this time.   PAST MEDICAL HISTORY:  1. Congestive heart failure secondary to diastolic dysfunction status      post  recent hospitalization.  2. Acute on chronic renal failure during recent hospitalization,      requiring a renal consult.  3. Acute on chronic severe lower extremity edema/leg ulcers being      followed by home health in the setting of chronic venous stasis.  4. History of pulmonary nodule 6-mm by CT scan in the anterior right      upper lobe.  Recommending follow up CT in 3 months.  5. Osteoarthritis.  6. Hyperlipidemia.  7. Ongoing tobacco use.  8. Iron-deficiency anemia.  9. History of cardiac catheterization in July 2009 with      recommendations for medical management.   REVIEW OF SYSTEMS:  As stated above.   CURRENT MEDICATIONS:  1. Metoprolol 25 mg b.i.d.  2. Lasix 80 mg daily.  3. K-Dur 10 mEq daily.  4. Simvastatin is on hold.  5. Enalapril is on hold.  6. Poly-iron daily.  7. Aspirin 81 mg daily.  8. Nitroglycerin p.r.n.  9. Zaroxolyn is on hold.   PHYSICAL EXAMINATION:  VITAL SIGNS:  Weight 182 pounds, weight down 6  pounds from previous visit in November, blood pressure 138/89, heart  rate 71.  GENERAL:  Ms. Gassett is in no acute distress.  NECK:  She has no signs of JVP at 45-degree angle.  LUNGS:  Clear to auscultation bilaterally.  CARDIOVASCULAR:  S1, S2.  Regular rate and rhythm.  ABDOMEN:  Soft, nontender, positive bowel sounds.  LOWER EXTREMITIES:  She has dressings dry and intact to bilateral lower  extremities.  NEUROLOGICAL:  Alert and oriented x3.   IMPRESSION:  Congestive heart failure secondary to diastolic dysfunction  with no signs of significant volume overload at this time.  I have  initiated heart failure education with Ms. Glance and her sister, Hilda Lias.  We will continue to follow a 1500 mL fluid restriction along with a 2 g  sodium restricted diet.  I have strongly encouraged her to establish  care with a primary care physician.  We will plan on repeating blood  work today based on results may need to further titrate her diuretic.  We will hold  on resuming her ACE inhibitor at this time secondary to  acute on chronic renal insufficiency.  The patient will need a follow up  CT of the chest within 3 months.  We will have the patient follow up  with Dr. Excell Seltzer regarding this.      Dorian Pod, ACNP  Electronically Signed      Rollene Rotunda, MD, [facility]  Electronically Signed   MB/MedQ  DD: 10/03/2008  DT: 10/04/2008  Job #: [id number]

## 2011-02-25 NOTE — Assessment & Plan Note (Signed)
[facility]                            CARDIOLOGY OFFICE NOTE   EVALYNN, HANKINS                           MRN:          [id number]  DATE:07/12/2008                            DOB:          12/16/1931    PRIMARY CARDIOLOGIST:  Veverly Fells. Excell Seltzer, MD   Ms. Sherri [name] is a very pleasant 77 year old African American female patient  who had a non-ST-elevation MI and congestive heart failure in July 2009.  She underwent diuresis followed by cardiac catheterization which showed  an ejection fraction of 65% with no valvular abnormalities.  She had a  75% LAD, 75% diagonal, 85% circumflex, 80% RCA, and 70% PDA.  Consideration was given to bypass surgery, but the patient opted against  this, and medical therapy was recommended.  I saw her in August, at  which time she was doing extremely well and was enrolled in cardiac  rehab.  Over the past couple of weeks, she has complained of increased  lower extremity edema, she has decreased her sodium intake dramatically,  and her Lasix was increased by Leotis Shames on June 22, 2008, to 40 mg  b.i.d.  This has not improved her edema at all.  We did have lab work  where her potassium was up to 6.1 and her potassium was stopped.  On  repeat lab, her sodium was 130, BUN 22, creatinine 1.0, potassium was  4.5.  She does admit to decreased urine output.  She denies any burning  sensation, but does not know why she cannot get rid of the fluid.  The  patient has not had an echo since July when she had her MI, at which  time her ejection fraction was normal.   The patient has gained over 19 pounds since I last saw her, and she is  becoming uncomfortable.  She said she can walk the track at cardiac  rehab 8 or 9 times before she gets short of breath and has to stop and  then she can finish her last 1 or 2 laps without any problem.  She  denies any chest pain, orthopnea, dizziness, or presyncope.  She does  not remember the event  surrounding her MI and did not feel she had chest  pain at that time.   CURRENT MEDICATIONS:  1. Metoprolol 25 mg b.i.d.  2. Simvastatin 40 mg daily.  3. Enalapril 2.5 mg b.i.d.  4. Ferrex 150 mg b.i.d.  5. Furosemide 40 mg b.i.d.   PHYSICAL EXAMINATION:  GENERAL:  This is a very pleasant 77 year old  African American female in no acute distress.  VITAL SIGNS:  Blood pressure 146/84, pulse 67, weight 181.  NECK:  Without JVD, HJR, bruit, or thyroid enlargement.  LUNGS:  Clear anterior, posterior, and lateral.  HEART:  Regular rate and rhythm at 67 beats per minute.  Normal S1 and  S2.  Positive S4.  No S3.  No murmur, rub, bruit, thrill, or heave  noted.  ABDOMEN:  Soft without organomegaly, masses, lesions, or abnormal  tenderness.  EXTREMITIES:  She has +3 to 4 edema all the  way up to her hips.  She  does have some oozing in her left lower anterior calf.  Clear liquid  oozing.   EKG normal sinus rhythm, low voltage, no acute change.   IMPRESSION:  1. Significant lower extremity edema, almost 20 pounds of fluid      overload, question etiology.  2. Status post non-ST-elevation myocardial infarction in July 2009      with congestive heart failure, resolved.  3. Three-vessel coronary artery disease with normal left ventricular      function.  Medical therapy recommended after the patient declined      bypass surgery.  4. Tobacco abuse, quit.  5. Family history of coronary artery disease.  6. Anemia.  7. Hyperlipidemia.   PLAN:  I have increased her Lasix to 80 mg in the morning and 40 in the  evening for 5 days and then back to 40 b.i.d.  We will check a CBC,  BMET, BNP as well as UA C&S.  I will repeat a 2-D echo since she has not  had one since her MI to make sure her LV function is stable.  I suppose  she could have some silent ischemia causing her heart failure, but we  will begin with the above testing first.  I will see her back in the  office next Wednesday.  I told  her if she does not lose weight in the  next 2 days, she needs to call us and we will get her back in.      Jacolyn Reedy, PA-C  Electronically Signed      Everardo Beals. Juanda Chance, MD, [facility]  Electronically Signed   ML/MedQ  DD: 07/12/2008  DT: 07/13/2008  Job #: [id number]

## 2011-02-25 NOTE — Cardiovascular Report (Signed)
NAME:  Sherri [name], Sherri [name]                  ACCOUNT NO.:  [id number]   MEDICAL RECORD NO.:  [id number]          PATIENT TYPE:  INP   LOCATION:  3705                         FACILITY:  [facility]   PHYSICIAN:  Veverly Fells. Excell Seltzer, MD  DATE OF BIRTH:  June 30, 1932   DATE OF PROCEDURE:  10/26/2008  DATE OF DISCHARGE:                            CARDIAC CATHETERIZATION   PROCEDURE:  Right and left heart catheterization.   INDICATIONS:  Sherri [name] is a 77 year old woman with anasarca.  She has  been very difficult to manage as an outpatient.  The etiology has been  presumed to be diastolic heart failure.  However, her renal  insufficiency has worsened with diuresis.  After discussion with her, I  elected to bring her in for a right and left and heart catheterization  to evaluate her hemodynamics now.   Risks and indications of procedure were reviewed with the patient.  Informed consent was obtained.  The right groin was prepped, draped, and  anesthetized with 1% lidocaine using modified Seldinger technique.  A 5-  French sheath was placed in the right femoral artery, 7-French sheath  was placed in the right femoral vein.  A pigtail catheter was advanced  to the left ventricle where pressures were recorded.  A pullback across  the aortic valve was done.  The right heart catheterization was done  under normal technique with a Swan-Ganz catheter.  Pressures were  recorded from the central aorta and pulmonary artery.  Cardiac output  was calculated by the Fick method.  The patient tolerated the procedure  well.  There were no immediate complications.   FINDINGS:  Right atrial pressure 7, right ventricular pressure 42/9,  pulmonary artery pressure 32/9 with a mean of 15, pulmonary capillary  wedge pressure is 4, left ventricular pressure 135/90, aortic pressure  128/68 with a mean of 94.   Oxygen saturation, pulmonary artery 66%, aorta 92%.   Cardiac output 7.4 liters per minute.  Cardiac index 3.8 liters  per  minute per meter squared.   ASSESSMENT:  Normal intracardiac hemodynamics.   PLAN:  Sherri [name] has massive edema.  It appears unrelated to congestive  heart failure as her intracardiac pressures are in the low normal range.  I am concerned that she may have nephrotic syndrome.  We will admit her  to the hospital and collect a 24-hour urine protein, also we will check  UPEP and SPEP to evaluate for amyloid doses.  We will ask for Nephrology  consult as well.      Veverly Fells. Excell Seltzer, MD  Electronically Signed     MDC/MEDQ  D:  10/26/2008  T:  10/26/2008  Job:  [id number]

## 2011-02-25 NOTE — Assessment & Plan Note (Signed)
[facility]                            CARDIOLOGY OFFICE NOTE   Sherri [name], Sherri [name]                           MRN:          [id number]  DATE:10/11/2008                            DOB:          15-Feb-1932    REASON FOR VISIT:  Congestive heart failure.   HISTORY OF PRESENT ILLNESS:  Sherri [name] is a delightful 77 year old  African American woman with diastolic heart failure.  She was  hospitalized in November 2009 with severe volume overload and  hyponatremia.  She was treated with conivaptan and intravenous Lasix.  She responded relatively well with fluid removal and correction of her  hyponatremia.  She also has undergone compression wraps of legs to treat  lower extremity edema.  Her edema is thought to be multifactorial.  She  has an albumin of 1.0, diastolic heart failure, as well as stasis.  Unfortunately, since the compression wraps have been used, she now  complains of edema throughout the abdomen.  Sometimes, it is difficult  for her breathe because it feels like her abdomen is restricted.  She  has been compliant with her diet.  She complains of exertional dyspnea.  She denies orthopnea or PND.   CURRENT MEDICATIONS:  1. Metoprolol 25 mg twice daily.  2. Klor-Con 10 mEq daily.  3. Ferrex 150 mg daily.  4. Aspirin 81 mg daily.  5. Lasix 80 mg in the morning and 40 mg in the evening.   ALLERGIES:  NKDA.   PHYSICAL EXAMINATION:  GENERAL:  The patient is alert and oriented, in  no acute distress.  VITAL SIGNS:  Weight is 184 pounds, blood pressure 130/72, heart rate  72, and respiratory rate 16.  HEENT:  Normal.  NECK:  Normal carotid upstrokes.  JVP normal.  LUNGS:  Clear bilaterally.  HEART:  Regular rate and rhythm.  No murmurs or gallops.  ABDOMEN:  Soft, obese, and nontender.  There is pitting edema of the  abdominal wall.  EXTREMITIES:  The lower extremities are wrapped in Ace bandages.  BACK:  There is presacral edema present.   ASSESSMENT:  Sherri [name] continues to have trouble with anasarca.  She has  become very difficult to manage.  With aggressive diuresis as an  outpatient involving Lasix and metolazone, she developed severe  hyponatremia requiring hospitalization.  She also has developed new  renal insufficiency.  It is possible that she has developed cardiorenal  syndrome.  We will check a repeat metabolic panel to reassess her  electrolytes as well as her creatinine.  It is possible that she has a  restrictive cardiomyopathy or this may be due to severe diastolic heart  failure.  I think ultimately it may be necessary to perform right and  left heart catheterization to reassess her  hemodynamics and determine the future treatment options.  We will review  this with the patient after other results of her metabolic panel are  back.     Veverly Fells. Excell Seltzer, MD  Electronically Signed    MDC/MedQ  DD: 10/23/2008  DT: 10/24/2008  Job #: [id number]

## 2011-02-25 NOTE — Discharge Summary (Signed)
Sherri [name], [name] NO.:  [id number]   MEDICAL RECORD NO.:  [id number]          PATIENT TYPE:  INP   LOCATION:  2032                         FACILITY:  [facility]   PHYSICIAN:  Veverly Fells. Excell Seltzer, MD  DATE OF BIRTH:  21-Dec-1931   DATE OF ADMISSION:  09/05/2008  DATE OF DISCHARGE:  09/14/2008                               DISCHARGE SUMMARY   PROCEDURES:  1. Renal ultrasound.  2. CT of the chest without contrast.   PRIMARY FINAL DISCHARGE DIAGNOSIS:  Acute on chronic diastolic  congestive heart failure with severe lower extremity edema.   SECONDARY DIAGNOSES:  1. Chronic kidney disease, stage III with a BUN of 34, creatinine      1.86, and GFR 32 at discharge.  2. Hyponatremia.  3. Hyperkalemia.  4. Hypotension.  5. Chronic venous stasis of the lower extremities with calf wounds.  6. History of pulmonary nodule, 6 mm by CT scan in the anterior right      upper lobe and slightly larger therefore followup recommended in 3      months.  7. Osteoarthritis.  8. Hard of hearing.  9. Severe lower extremity edema with chronic stasis changes and venous      ulcers.  10.Hyperlipidemia.  11.Iron deficiency anemia.  12.Ongoing tobacco use.  13.History of cardiac catheterization in July 2009 with left anterior      descending 75%, diagonal 75%, circumflex 85%, right coronary artery      80%, posterior descending artery 70%, managed medically.  14.Family history of coronary artery disease.   TIME OF DISCHARGE:  41 minutes.   HOSPITAL COURSE:  Sherri [name] is a 77 year old female with known diastolic  heart failure and coronary artery disease.  She was seen in the office  on September 05, 2008, for increasing edema.  She was admitted for  further evaluation and treatment.   She was diuresed with IV Lasix 40 mg IV t.i.d.  As her volume status  improved, she was transitioned back to p.o. Lasix, initially 80 mg p.o.  b.i.d., but then because of worsening creatinine she was  switched back  to 80 mg daily.  At one point, her sodium was as low as 116 and an with  her worsening kidney function, a renal consult was called.  She was seen  by Dr. Hyman Hopes and he felt that this was most likely ATN.  Her urine output  was excellent.  Her cortisol level was 9.6 with a TSH within normal  limits at 3.16.  Iron was slightly low at 36 and she is to continue her  iron supplementation.   Wound care consult was called.  She was seen by the wound care nurses  while she was in the hospital.  In a followup visit, the wound was  decreasing in size and only a small amount of drainage.  She is to  continue the Mepilex to promote healing and the home health nurses to  follow her as an outpatient.  She is to continue TED hose as well.  A  smoking cessation consult was called because  of her history of tobacco  use.  A nutrition assessment was also performed.   On September 14, 2008, Sherri [name] was seen by Dr. Excell Seltzer.  Her ACE inhibitor  was held because of her worsening renal function.  Her albumin level is  extremely low at less than 1.0.  Total protein is also low at 3.9.  On  the nutrition assessment, it was felt that p.o. intake was adequate and  no supplements were indicated.  She was given information on a heart-  healthy diet.  Dr. Excell Seltzer evaluated Sherri [name] on September 14, 2089, and  considered her stable for discharge with close outpatient followup.  Of  note, by discharge, her sodium had increased to 132.  Her hemoglobin was  8.7 with a hematocrit of 25.4.  Fecal occult blood was positive x1.  She  is to follow up with her primary care physician for this.   DISCHARGE INSTRUCTIONS:  Her activity level is to be increased  gradually.  She is to weigh herself daily.  She is to stick to a low-  sodium diet.  She is not to use tobacco.  She is to follow up with  Sherri [name] at the [facility] on September 21, 2008, at 3:30.  She is  to follow up with Dr. Excell Seltzer in October 24, 2008, at  9:15.   DISCHARGE MEDICATIONS:  1. Lasix 80 mg daily.  2. K-Dur 10 mEq daily.  3. Metoprolol 25 mg b.i.d.  4. Simvastatin is on hold.  5. Enalapril is on hold.  6. Poly-Iron daily.  7. Aspirin 81 mg daily.  8. Nitroglycerin sublingual p.r.n.  9. Zaroxolyn is on hold.      Theodore Demark, PA-C      Veverly Fells. Excell Seltzer, MD  Electronically Signed    RB/MEDQ  D:  09/14/2008  T:  09/14/2008  Job:  [id number]   cc:   CHF Clinic

## 2011-02-25 NOTE — Assessment & Plan Note (Signed)
[facility]                            CARDIOLOGY OFFICE NOTE   INITA, URAM                           MRN:          [id number]  DATE:07/28/2008                            DOB:          1932-08-09    REASON FOR VISIT:  Congestive heart failure.   HISTORY OF PRESENT ILLNESS:  Ms. Raczkowski is a delightful 77 year old woman  with diastolic heart failure and multivessel CAD.  She was hospitalized  in July with a non-ST-elevation MI and CHF.  She was found to have  multivessel disease that was not amenable to PCI.  She declined surgery  and has been treated medically.  She has had continued problems with  weight gain and lower extremity edema in spite of increasing diuretic  dose.  She was seen by Jacolyn Reedy in August and then again on July 19, 2008, with progressive weight gain.  Most recently, her Lasix was  increased to 80 mg in the morning and 40 mg in the evening, but she  continues to note increased weight.  In review of her chart, her weight  was 162 pounds when she followed up on May 17, 2008, had increased to  181 pounds on July 12, 2008, and her weight is now 187 pounds.  In  spite of her leg edema, she denies orthopnea, PND, or dyspnea.  She does  complain of abdominal fullness and early satiety.  Sometimes she notices  a good effect from Lasix and another time she does not.  She has been  avoiding salt and has otherwise been compliant with her medications.   MEDICATIONS:  1. Metoprolol 25 mg b.i.d.  2. Simvastatin 40 mg daily.  3. Enalapril 2.5 mg twice daily,  4. Ferrex 150 mg b.i.d.  5. Klor-Con is on hold.  6. Lasix 80 mg in the morning and 40 mg in the evening.   ALLERGIES:  NKDA.   PHYSICAL EXAMINATION:  GENERAL:  The patient is alert and oriented.  She  is in no acute distress.  VITAL SIGNS:  Weight is 187 pounds, blood pressure 110/70, heart rate  72, respiratory rate 16.  HEENT:  Normal.  NECK:  JVP is elevated.  LUNGS:  Clear bilaterally.  HEART:  Regular rate and rhythm without murmurs or gallops.  ABDOMEN:  Soft and nontender with mild distention.  EXTREMITIES:  There is massive bilateral lower extremity edema with  tenseness in both legs.  Peripheral pulses are intact and equal.   ASSESSMENT:  1. Acute on chronic diastolic heart failure with marked volume      overload.  I think Ms. Seifert would be best treated in the hospital.      She would like to try to continue treatment as an outpatient since      she has no dyspnea or other symptoms.  I think this will be very      difficult, but will try for 1 week to see if we can get some volume      off her.  I have asked her to increase her Lasix  dose to 80 mg      twice daily and to start metolazone 2.5 mg every other day to be      taken 30 minutes prior to her morning Lasix dose.  She has had      problems with hyperkalemia in the past and her potassium is      currently on hold.  We will resume potassium chloride 20 mEq daily      and will follow up with a metabolic panel early next week as well      as a followup nurse visit mid week.  If she has persistent volume      overload without a good response to treatment, I would think she      should be hospitalized and treated with intravenous diuresis versus      ultrafiltration.  2. Multivessel coronary artery disease.  Continue medical therapy.      The patient is without angina and has no evidence of ongoing      ischemia.   For followup, Ms. Fennell will be seen next week as outlined.  Further  plans pending her clinical response to diuresis.     Veverly Fells. Excell Seltzer, MD  Electronically Signed    MDC/MedQ  DD: 07/31/2008  DT: 08/01/2008  Job #: [id number]

## 2011-02-25 NOTE — H&P (Signed)
NAME:  Sherri [name], Sherri [name]                  ACCOUNT NO.:  [id number]   MEDICAL RECORD NO.:  [id number]          PATIENT TYPE:  INP   LOCATION:  3707                         FACILITY:  [facility]   PHYSICIAN:  Rollene Rotunda, MD, FACCDATE OF BIRTH:  Nov 22, 1931   DATE OF ADMISSION:  04/20/2008  DATE OF DISCHARGE:                              HISTORY & PHYSICAL   PRIMARY CARDIOLOGIST:  Primary cardiologist will be new, Rollene Rotunda,  MD, [facility].   PRIMARY CARE PHYSICIAN:  She does not have one.   HISTORY OF PRESENT ILLNESS:  This is a very pleasant 77 year old African-  American female with no prior cardiac history who has not been seen by a  physician in over 14 years who 2 months ago noticed an increased  swelling in her legs and thought that it was related to her arthritis.  She has had worsening swelling since last Wednesday becoming more severe  over the last 2 days with associated nausea and diarrhea.  She became  short of breath with associated wheezing and did not sleep well over the  last few days with complaints of severe headache, and the dyspnea on  exertion was worsening.  She also complained of some substernal  epigastric discomfort with associated nausea and vomiting approximately  3 days ago.  As a result of these symptoms and progressive lower  extremity edema and shortness of breath, she presented to [facility].  At [facility], she was given 40 mg of Lasix IV after  evaluation revealing an elevated BNP of 1090.  She also was  hypertensive.  Her chest x-ray revealed some pleural effusion.  As a  result of the Lasix, the patient did diurese approximately 600 cc.  Cardiac enzymes were cycled and found to be positive with a troponin of  1.19.  The patient was transferred to [facility] and is now  currently on telemetry where we are seeing her.   REVIEW OF SYMPTOMS:  Positive for headache; lower extremity edema,  progressive past the knees; nausea; vomiting;  and substernal chest  discomfort with burning, otherwise, negative.   PAST MEDICAL HISTORY:  Arthritis.   CURRENT MEDICATIONS:  None at home, currently heparin drip and  nitroglycerin paste.   ALLERGIES:  No known drug allergies.   SOCIAL HISTORY:  She lives in [place] with her son.  She is a 60-pack-  year smoker, negative for EtOH.  She is single.  She has no history of  drug use or illicit drug use.   FAMILY HISTORY:  Mother died of multiple MIs.  Father died of an MI.  She had a sister with CAD and heart trouble and 2 other sisters with no  heart trouble.   PHYSICAL EXAMINATION:  Vital Signs: Blood pressure 182/107 initially,  now 130/70; heart rate 98; respirations 14; temperature 97.9; and O2 sat  95% on room air.  HEENT: Head is normocephalic and atraumatic.  Eyes:  PERRLA.  Mouth: Mucous membranes of mouth pink and moist.  Tongue is midline.  Neck: Supple.  Positive JVD to the mandible.  There are no bruits  appreciated.  Cardiovascular: Regular rate and rhythm with S3 murmur.  1/6 systolic  murmur is also auscultated.  Pulses are equal bilaterally, dorsalis  pedis and posterior tibial without bruits.  Lungs: Some mild wheezing inspiratory with diminished bibasilar breath  sounds.  Abdomen: Soft, nontender, 2+ bowel sounds.  Extremities: 2+ pitting edema pretibially and 1+ pitting edema distal to  the knees into the thigh area.  Neuro: Cranial nerves II through XII nerves are grossly intact with the  exception of some mild hard of hearing.   IMAGING:  Chest x-ray revealed mild cardiomegaly, COPD with small  pleural effusions.  EKG revealed normal sinus rhythm with a rate of 90  with Q-waves noted in lead II and aVF and V3 and V4.   LABORATORY DATA:  Sodium 142, potassium 3.7, chloride 103, CO2 of 32,  BUN 13, creatinine 0.92, and glucose 114.  Hemoglobin 11.4, hematocrit  33.0, white blood cells 7.1, and platelets 330.  BNP 1090.  CK 409, MB  9.8, and troponin  1.19.   IMPRESSION:  1. Non-ST-elevated myocardial infarction type II.  2. Acute congestive heart failure.   PLAN:  The patient was seen and examined by myself and Dr. Rollene Rotunda at bedside.  She has no acute EKG changes, no pain.  I suspect  dilated cardiomyopathy.  IV diuresis will be continued.  The patient  presented with anasarca, but denies any chest pain, PND, or orthopnea.  We will start the patient on ACE inhibitor, enalapril 2.5 mg b.i.d.  Continue heparin drip, enteric-coated aspirin, and cycle cardiac enzymes  to rule out MI.  We will continue nitroglycerin 1 inch  to the chest  wall every 6 hours.  We will make further recommendations and discuss  needs for cardiac catheterization once the patient is diuresed and  echocardiogram is completed for LV function.      Bettey Mare. Lyman Bishop, NP      Rollene Rotunda, MD, [facility]  Electronically Signed    KML/MEDQ  D:  04/20/2008  T:  04/21/2008  Job:  [id number]

## 2011-02-25 NOTE — Assessment & Plan Note (Signed)
Sherri [name]                            CARDIOLOGY OFFICE NOTE   Sherri [name], Sherri [name]                           MRN:          [id number]  DATE:09/05/2008                            DOB:          [birth year]    REASON FOR ADMISSION:  Lower extremity edema.   HISTORY OF PRESENT ILLNESS:  Sherri [name] is a delightful 77 year old woman  who has had severe lower extremity edema.  I recommended recommended  hospital admission last month but she declined and wanted to try  management as an outpatient.  She has been diuresed with oral Lasix and  metolazone without success.  She has developed venous stasis ulcerations  and weeping from her legs.  She has edema all the way up to her thighs.  Interestingly she denies shortness of breath, orthopnea or PND.  She has  a sensation of mild abdominal fullness but has continued with a fairly  good appetite.  She denies chest pain, lightheadedness, syncope, or  other complaints.   MEDICATIONS:  1. Metoprolol 25 mg b.i.d.  2. Simvastatin 40 mg daily.  3. Enalapril 2.5 mg b.i.d.  4. Ferrex 150 mg b.i.d.  5. Lasix 80 mg b.i.d.  6. Metolazone 2.5 mg every other day.  7. Klor-Con 20 mEq daily.   ALLERGIES:  NKDA.   PAST MEDICAL HISTORY:  1. Diastolic heart failure with problems as above.  Main manifestation      is severe lower extremity edema.  Previous hospital admissions with      severe edema.  Left ventricular ejection fraction is preserved.  2. Osteoarthritis.  3. Hearing loss.  4. Hyperlipidemia.  5. Iron deficiency anemia  6. History of tobacco abuse.  7. Three-vessel coronary artery disease, managed medically.   SOCIAL HISTORY:  The patient lives locally.  She has good family support  in the area.  She has a long smoking history.  She does not drink  alcohol.  No history of illicit drug use.   FAMILY HISTORY:  Mother had multiple myocardial infarctions.  Fathers  had a myocardial infarction.   REVIEW OF  SYSTEMS:  A 12-point review of systems was performed, negative  except as detailed above.   PHYSICAL EXAMINATION:  GENERAL:  The patient is alert and oriented.  She  is an elderly woman in no acute distress.  VITAL SIGNS:  Weight is 188 pounds, blood pressure 130/70, heart rate  64, respiratory rate 16.  HEENT:  Normal.  NECK:  Normal carotid upstrokes.  No bruits.  JVP is moderately  elevated.  No thyromegaly or thyroid nodules.  LUNGS:  Clear  bilaterally.  HEART:  The apex is discrete and nondisplaced.  Regular rate and rhythm.  There is no right ventricular heave or lift.  ABDOMEN:  Soft, nontender, no organomegaly.  No abdominal bruits.  BACK:  No CVA tenderness.  EXTREMITIES:  There is 3+ pretibial edema all the way up to the mid  thigh region with weeping of fluid.  There are venous stasis ulcerations  also draining.  Peripheral pulses are intact and equal.  SKIN:  Multiple lower extremity venous stasis ulcerations.  There is no  erythema present.  NEUROLOGIC:  Cranial nerves II-XII are intact.  Strength intact and  equal.   LABORATORY DATA:  Most recent labs from November 18 showed a sodium of  121, potassium 3.7, chloride 86, CO2 31, glucose 97, BUN 23, creatinine  1.4, calcium 7.5.  BMP 298.   ASSESSMENT:  This is a 77 year old woman with diastolic heart failure  and marked volume overload with severe lower extremity edema.  She  clearly has severe volume overload with the presence of hyponatremia as  well.  Sherri [name] really needs hospital admission for inpatient  management.  I am going to give her a trial of IV diuresis but I suspect  she will require ultrafiltration for volume removal.  Will check a urine  sodium on arrival to the hospital as well as a repeat metabolic panel.  She will be managed carefully with respect to her renal function and  electrolyte abnormalities.  Unfortunately, there are no hospital beds at  present and she really wishes to avoid the  emergency department.  She is  going to go home with her sister and they will be contacted as soon as  there is a bed available.  I have asked them to go to the hospital by  noon tomorrow even if there are no beds available.  Further plans  pending her response to diuresis.   Her other medical problems are stable and she will remain on her home  medical regimen for those issues.     Veverly Fells. Excell Seltzer, MD  Electronically Signed    MDC/MedQ  DD: 09/05/2008  DT: 09/05/2008  Job #: [id number]

## 2011-02-25 NOTE — Assessment & Plan Note (Signed)
[facility]                            CARDIOLOGY OFFICE NOTE   ROSANNE, WOHLFARTH                           MRN:          [id number]  DATE:05/17/2008                            DOB:          06-06-1932    PRIMARY CARDIOLOGIST:  Veverly Fells. Excell Seltzer, MD   This is a 77 year old African American female patient who presented to  [facility] with a non-ST-elevation MI and acute congestive heart  failure.  She underwent diuresis followed by cardiac catheterization,  which showed ejection fraction of 65% with no significant valvular  abnormalities.  She had a 75% LAD, 75% diagonal, 85% circumflex, 80%  RCA, and 70% PDA.  Consideration was given to bypass surgery, but the  patient opted against this, and medical therapy was recommended.  She  has done extremely well since she has been at home.  She quit smoking.  She has limited her sodium dramatically, and she is feeling much better.  She denies any chest pain or shortness of breath and is ready to start  cardiac rehab.   CURRENT MEDICATIONS:  1. Metoprolol 25 mg b.i.d.  2. Simvastatin 40 mg daily.  3. Klor-Con 20 mEq daily.  4. Enalapril 12.5 mg b.i.d.  5. Furosemide 60 mg daily.  6. Ferrets 150 mg b.i.d.   PHYSICAL EXAMINATION:  GENERAL:  This is a pleasant 77 year old African  American female in no acute distress.  VITAL SIGNS:  Blood pressure 133/81, pulse 56, and weight 162.  NECK:  Without JVD, HJR, bruit, or thyroid enlargement.  LUNGS:  Decreased breath sounds at the right base, otherwise clear.  HEART:  Regular rate and rhythm at 56 beats per minute.  Normal S1 and  S2.  No murmur, rub, bruit, thrill, or heave noted.  ABDOMEN:  Soft without organomegaly, masses, lesions, or abnormal  tenderness.  Right groin is without hematoma or hemorrhage.  EXTREMITIES:  Without cyanosis or clubbing.  She has a trace of edema in  her ankles bilaterally.  Positive distal pulses.   EKG sinus bradycardia,  poor R-wave progression, and T-wave inversion  inferiorly.   IMPRESSION:  1. Non-ST-elevation myocardial infarction on April 20, 2008 with      congestive heart failure, resolved.  2. Three-vessel coronary artery disease with normal left ventricular      function.  Recommend medical therapy.  3. Tobacco abuse, quit.  4. Family history of coronary artery disease.  5. Anemia.  6. Hyperlipidemia.   PLAN:  The patient is doing extremely well post MI and heart failure.  She is asking me to enroll her in cardiac rehab, which we will do, and  she will follow up with Dr. Excell Seltzer in 2-3 months.      Jacolyn Reedy, PA-C  Electronically Signed      Rollene Rotunda, MD, [facility]  Electronically Signed   ML/MedQ  DD: 05/17/2008  DT: 05/18/2008  Job #: [id number]

## 2011-02-25 NOTE — Assessment & Plan Note (Signed)
[facility]                            CARDIOLOGY OFFICE NOTE   DIASHA, CASTLEMAN                           MRN:          [id number]  DATE:08/08/2008                            DOB:          Mar 17, 1932    Sherri [name] was seen in followup at the [facility] Cardiology office August 08, 2008.  She presented a few weeks back with marked volume overload.  She had gained well over 25 pounds of fluid weight even with increasing  the Lasix doses.  I had recommended hospital admission, but she was not  interested.  I thought she would have of much more rapid response with  IV diuresis versus ultrafiltration.  Since she declined hospitalization,  I elected to try the addition of metolazone to her regimen.  Her Lasix  was increased to 80 mg twice daily and she was started on 2.5 mg of  metolazone to be taken every other day.  She is seen today in followup.  Her weight is down 8 pounds from July 28, 2008.  She reports good  diuresis after she takes her medications especially on the days that she  takes metolazone.  She continues to have mild exertional dyspnea, but  she is able to participate in cardiac rehab.  She denies orthopnea or  PND.  She has a good appetite and denies abdominal bloating.  Her legs  remain swollen and she only appreciate some minor difference since she  was started on increased diuretics with respect to her leg edema.   MEDICATIONS:  1. Metoprolol 25 mg b.i.d.  2. Simvastatin 40 mg daily.  3. Enalapril 2.5 mg twice daily.  4. Ferrex 150 mg twice daily.  5. Lasix 80 mg twice daily.  6. Metolazone 2.5 mg every other day.  7. Klor-Con 20 mEq daily.   PHYSICAL EXAMINATION:  GENERAL:  The patient is alert and oriented.  She  is a delightful woman in no acute distress.  VITAL SIGNS:  Weight is 179 pounds, blood pressure 120/80, heart rate is  64, respiratory rate is 16.  HEENT:  Normal.  NECK:  Normal carotid upstrokes.  No bruits.  JVP is  moderately  elevated.  LUNGS:  Clear bilaterally.  HEART:  Regular rate and rhythm.  No murmurs or gallops.  ABDOMEN:  Soft, nontender, no organomegaly.  EXTREMITIES:  There is 3+ bilateral pretibial edema.   LABORATORY DATA:  From August 02, 2008, showed a sodium of 133,  potassium 4.2, BUN 25, and creatinine 1.3.   ASSESSMENT:  Diastolic heart failure with marked volume overload.  She  is making slow improvement with outpatient diuresis.  We will continue  her current regimen of Lasix 80 mg twice daily, metolazone every other  day.  Followup metabolic panel again next week with a weight check.  She  will have a nurse visit in 2 weeks and I would like to see her back in 4  weeks.  We will continue with every other week metabolic panel as well.  She is being aggressively diuresed.  Hopefully, we will be able  to avoid  hospitalization as per her wishes.  Her blood pressure and heart rate  remained well controlled.  She has no signs or symptoms of pulmonary  congestion.     Veverly Fells. Excell Seltzer, MD  Electronically Signed    MDC/MedQ  DD: 08/08/2008  DT: 08/09/2008  Job #: [id number]

## 2011-02-25 NOTE — Assessment & Plan Note (Signed)
[facility]                                 ON-CALL NOTE   MIKAELAH, TROSTLE                           MRN:          [id number]  DATE:06/29/2008                            DOB:          07/27/1932    Apparently, Ms. Bomberger had some blood work drawn today at Dr. Earmon Phoenix  office.  The lab called and stated that her potassium was 6.1.  I spoke  with her over the phone, and she states that she is doing fine, not  having any problems.  It is also noted on the blood work that her kidney  function was within normal limits.  I asked the patient not to take her  Klor-Con anymore.  She implied that she was taking this twice a day;  however, Dr. Earmon Phoenix office note dated on 05/17/2008 stated that she  was supposed to be taking this once a day.  Again, I reiterated to her  not to take any further Klor-Con and I have left a message at the office  that she needs a BMET rechecked on Monday.  I have also explained this  several times to Ms. Luberto as if she is hard-of-hearing and I have asked  the office to call her on Monday morning to remind her to repeat a BMET.     Joellyn Rued, PA-C  Electronically Signed    EW/MedQ  DD: 06/29/2008  DT: 06/30/2008  Job #: [id number]

## 2011-03-14 ENCOUNTER — Other Ambulatory Visit: Payer: Self-pay | Admitting: Nephrology

## 2011-03-14 ENCOUNTER — Encounter (HOSPITAL_COMMUNITY): Payer: Medicare Other | Attending: Nephrology

## 2011-03-14 DIAGNOSIS — N049 Nephrotic syndrome with unspecified morphologic changes: Secondary | ICD-10-CM | POA: Insufficient documentation

## 2011-03-14 DIAGNOSIS — N184 Chronic kidney disease, stage 4 (severe): Secondary | ICD-10-CM | POA: Insufficient documentation

## 2011-03-14 DIAGNOSIS — D638 Anemia in other chronic diseases classified elsewhere: Secondary | ICD-10-CM | POA: Insufficient documentation

## 2011-03-14 LAB — RENAL FUNCTION PANEL
BUN: 32 mg/dL — ABNORMAL HIGH (ref 6–23)
CO2: 19 mEq/L (ref 19–32)
Calcium: 7.8 mg/dL — ABNORMAL LOW (ref 8.4–10.5)
Creatinine, Ser: 4.66 mg/dL — ABNORMAL HIGH (ref 0.4–1.2)
Glucose, Bld: 95 mg/dL (ref 70–99)
Phosphorus: 4.2 mg/dL (ref 2.3–4.6)

## 2011-03-14 LAB — FERRITIN: Ferritin: 116 ng/mL (ref 10–291)

## 2011-03-14 LAB — IRON AND TIBC: UIBC: 117 ug/dL

## 2011-04-11 ENCOUNTER — Other Ambulatory Visit: Payer: Self-pay | Admitting: Nephrology

## 2011-04-11 ENCOUNTER — Encounter (HOSPITAL_COMMUNITY): Payer: Medicare Other | Attending: Nephrology

## 2011-04-11 DIAGNOSIS — N184 Chronic kidney disease, stage 4 (severe): Secondary | ICD-10-CM | POA: Insufficient documentation

## 2011-04-11 DIAGNOSIS — D638 Anemia in other chronic diseases classified elsewhere: Secondary | ICD-10-CM | POA: Insufficient documentation

## 2011-04-11 DIAGNOSIS — N049 Nephrotic syndrome with unspecified morphologic changes: Secondary | ICD-10-CM | POA: Insufficient documentation

## 2011-04-11 LAB — POCT HEMOGLOBIN-HEMACUE: Hemoglobin: 11.1 g/dL — ABNORMAL LOW (ref 12.0–15.0)

## 2011-04-11 LAB — RENAL FUNCTION PANEL
Albumin: 1.6 g/dL — ABNORMAL LOW (ref 3.5–5.2)
BUN: 43 mg/dL — ABNORMAL HIGH (ref 6–23)
Chloride: 108 mEq/L (ref 96–112)
GFR calc non Af Amer: 8 mL/min — ABNORMAL LOW (ref >60)
Glucose, Bld: 93 mg/dL (ref 70–99)
Phosphorus: 4.2 mg/dL (ref 2.3–4.6)
Potassium: 4 mEq/L (ref 3.5–5.1)

## 2011-04-11 LAB — IRON AND TIBC
Iron: 64 ug/dL (ref 42–135)
TIBC: 203 ug/dL — ABNORMAL LOW (ref 250–470)

## 2011-04-11 LAB — FERRITIN: Ferritin: 118 ng/mL (ref 10–291)

## 2011-05-09 ENCOUNTER — Other Ambulatory Visit: Payer: Self-pay | Admitting: Nephrology

## 2011-05-09 ENCOUNTER — Encounter (HOSPITAL_COMMUNITY): Payer: Medicare Other | Attending: Nephrology

## 2011-05-09 DIAGNOSIS — D638 Anemia in other chronic diseases classified elsewhere: Secondary | ICD-10-CM | POA: Insufficient documentation

## 2011-05-09 DIAGNOSIS — N184 Chronic kidney disease, stage 4 (severe): Secondary | ICD-10-CM | POA: Insufficient documentation

## 2011-05-09 DIAGNOSIS — N049 Nephrotic syndrome with unspecified morphologic changes: Secondary | ICD-10-CM | POA: Insufficient documentation

## 2011-05-09 LAB — RENAL FUNCTION PANEL
Albumin: 1.8 g/dL — ABNORMAL LOW (ref 3.5–5.2)
BUN: 34 mg/dL — ABNORMAL HIGH (ref 6–23)
CO2: 17 mEq/L — ABNORMAL LOW (ref 19–32)
Chloride: 113 mEq/L — ABNORMAL HIGH (ref 96–112)
Creatinine, Ser: 5.18 mg/dL — ABNORMAL HIGH (ref 0.50–1.10)
GFR calc non Af Amer: 8 mL/min — ABNORMAL LOW (ref >60)
Potassium: 3.6 mEq/L (ref 3.5–5.1)

## 2011-05-09 LAB — IRON AND TIBC: Iron: 93 ug/dL (ref 42–135)

## 2011-05-09 LAB — POCT HEMOGLOBIN-HEMACUE: Hemoglobin: 11.2 g/dL — ABNORMAL LOW (ref 12.0–15.0)

## 2011-06-06 ENCOUNTER — Other Ambulatory Visit: Payer: Self-pay | Admitting: Nephrology

## 2011-06-06 ENCOUNTER — Encounter (HOSPITAL_COMMUNITY): Payer: Medicare Other | Attending: Nephrology

## 2011-06-06 DIAGNOSIS — D638 Anemia in other chronic diseases classified elsewhere: Secondary | ICD-10-CM | POA: Insufficient documentation

## 2011-06-06 DIAGNOSIS — N049 Nephrotic syndrome with unspecified morphologic changes: Secondary | ICD-10-CM | POA: Insufficient documentation

## 2011-06-06 DIAGNOSIS — N184 Chronic kidney disease, stage 4 (severe): Secondary | ICD-10-CM | POA: Insufficient documentation

## 2011-06-06 LAB — IRON AND TIBC
Saturation Ratios: 43 % (ref 20–55)
TIBC: 180 ug/dL — ABNORMAL LOW (ref 250–470)
UIBC: 103 ug/dL

## 2011-06-06 LAB — RENAL FUNCTION PANEL
BUN: 44 mg/dL — ABNORMAL HIGH (ref 6–23)
CO2: 18 mEq/L — ABNORMAL LOW (ref 19–32)
Calcium: 8 mg/dL — ABNORMAL LOW (ref 8.4–10.5)
Chloride: 114 mEq/L — ABNORMAL HIGH (ref 96–112)
Creatinine, Ser: 5.06 mg/dL — ABNORMAL HIGH (ref 0.50–1.10)

## 2011-07-04 ENCOUNTER — Encounter (HOSPITAL_COMMUNITY): Payer: Medicare Other | Attending: Nephrology

## 2011-07-04 ENCOUNTER — Other Ambulatory Visit: Payer: Self-pay | Admitting: Nephrology

## 2011-07-04 DIAGNOSIS — D638 Anemia in other chronic diseases classified elsewhere: Secondary | ICD-10-CM | POA: Insufficient documentation

## 2011-07-04 DIAGNOSIS — N184 Chronic kidney disease, stage 4 (severe): Secondary | ICD-10-CM | POA: Insufficient documentation

## 2011-07-04 DIAGNOSIS — N049 Nephrotic syndrome with unspecified morphologic changes: Secondary | ICD-10-CM | POA: Insufficient documentation

## 2011-07-04 LAB — RENAL FUNCTION PANEL
Albumin: 1.8 g/dL — ABNORMAL LOW (ref 3.5–5.2)
BUN: 45 mg/dL — ABNORMAL HIGH (ref 6–23)
Chloride: 112 mEq/L (ref 96–112)
GFR calc Af Amer: 9 mL/min — ABNORMAL LOW (ref >60)
GFR calc non Af Amer: 8 mL/min — ABNORMAL LOW (ref >60)
Phosphorus: 4.8 mg/dL — ABNORMAL HIGH (ref 2.3–4.6)
Potassium: 3.7 mEq/L (ref 3.5–5.1)
Sodium: 139 mEq/L (ref 135–145)

## 2011-07-05 LAB — IRON AND TIBC: Iron: 81 ug/dL (ref 42–135)

## 2011-07-05 LAB — FERRITIN: Ferritin: 116 ng/mL (ref 10–291)

## 2011-07-10 LAB — COMPREHENSIVE METABOLIC PANEL
ALT: 19
Alkaline Phosphatase: 114
BUN: 14
CO2: 31
Chloride: 99
GFR calc non Af Amer: 54 — ABNORMAL LOW
Glucose, Bld: 96
Potassium: 4.5
Sodium: 139
Total Bilirubin: 0.5
Total Protein: 5.1 — ABNORMAL LOW

## 2011-07-10 LAB — CBC
HCT: 29.7 — ABNORMAL LOW
HCT: 32.3 — ABNORMAL LOW
Hemoglobin: 11.4 — ABNORMAL LOW
Hemoglobin: 10.2 — ABNORMAL LOW
Hemoglobin: 10.8 — ABNORMAL LOW
Hemoglobin: 11.3 — ABNORMAL LOW
MCHC: 33.7
MCHC: 33.7
MCHC: 33.9
MCHC: 34.3
MCV: 90.7
MCV: 92.4
MCV: 92.7
MCV: 92.8
MCV: 93.1
MCV: 93.6
Platelets: 218
Platelets: 232
Platelets: 238
Platelets: 242
RBC: 3.64 — ABNORMAL LOW
RBC: 3.21 — ABNORMAL LOW
RBC: 3.27 — ABNORMAL LOW
RBC: 3.3 — ABNORMAL LOW
RBC: 3.64 — ABNORMAL LOW
RDW: 13.6
RDW: 14.1
WBC: 7.1
WBC: 5.3
WBC: 5.7
WBC: 6.4
WBC: 6.7
WBC: 7.2
WBC: 7.5

## 2011-07-10 LAB — DIFFERENTIAL
Eosinophils Absolute: 0.1
Lymphs Abs: 1.5
Monocytes Absolute: 0.4
Monocytes Relative: 6
Neutrophils Relative %: 71

## 2011-07-10 LAB — BASIC METABOLIC PANEL
BUN: 10
CO2: 32
CO2: 34 — ABNORMAL HIGH
CO2: 34 — ABNORMAL HIGH
CO2: 35 — ABNORMAL HIGH
Calcium: 8.1 — ABNORMAL LOW
Calcium: 7.9 — ABNORMAL LOW
Calcium: 7.9 — ABNORMAL LOW
Calcium: 8 — ABNORMAL LOW
Chloride: 105
Chloride: 99
Chloride: 99
Creatinine, Ser: 0.91
Creatinine, Ser: 0.92
Creatinine, Ser: 1.06
GFR calc Af Amer: >60
GFR calc Af Amer: >60
GFR calc Af Amer: >60
GFR calc Af Amer: >60
GFR calc Af Amer: >60
GFR calc Af Amer: >60
GFR calc Af Amer: >60
GFR calc non Af Amer: 60 — ABNORMAL LOW
GFR calc non Af Amer: 51 — ABNORMAL LOW
GFR calc non Af Amer: 56 — ABNORMAL LOW
GFR calc non Af Amer: 56 — ABNORMAL LOW
GFR calc non Af Amer: >60
GFR calc non Af Amer: >60
Potassium: 3.3 — ABNORMAL LOW
Potassium: 4.1
Potassium: 4.2
Potassium: 4.8
Sodium: 142
Sodium: 139
Sodium: 140
Sodium: 141
Sodium: 142
Sodium: 143

## 2011-07-10 LAB — POCT I-STAT 3, ART BLOOD GAS (G3+)
Bicarbonate: 36.4 — ABNORMAL HIGH
O2 Saturation: 89
TCO2: 38
pCO2 arterial: 46.2 — ABNORMAL HIGH
pO2, Arterial: 52 — ABNORMAL LOW

## 2011-07-10 LAB — FOLATE RBC: RBC Folate: 1189 — ABNORMAL HIGH

## 2011-07-10 LAB — POCT CARDIAC MARKERS
CKMB, poc: 9.8
Myoglobin, poc: 409

## 2011-07-10 LAB — CARDIAC PANEL(CRET KIN+CKTOT+MB+TROPI)
CK, MB: 2.2
CK, MB: 4.2 — ABNORMAL HIGH
Relative Index: 2.7 — ABNORMAL HIGH
Total CK: 108
Total CK: 92
Troponin I: 1.17
Troponin I: 1.54
Troponin I: 1.58

## 2011-07-10 LAB — POCT I-STAT 3, VENOUS BLOOD GAS (G3P V)
Bicarbonate: 36.5 — ABNORMAL HIGH
Operator id: 118911
TCO2: 38
pCO2, Ven: 54.3 — ABNORMAL HIGH
pH, Ven: 7.435 — ABNORMAL HIGH
pO2, Ven: 35

## 2011-07-10 LAB — FERRITIN: Ferritin: 114 (ref 10–291)

## 2011-07-10 LAB — MAGNESIUM: Magnesium: 2.2

## 2011-07-10 LAB — HEPARIN LEVEL (UNFRACTIONATED)
Heparin Unfractionated: 0.42
Heparin Unfractionated: 0.57
Heparin Unfractionated: 0.59
Heparin Unfractionated: 0.83 — ABNORMAL HIGH

## 2011-07-10 LAB — LIPID PANEL: VLDL: 22

## 2011-07-11 LAB — CBC
Hemoglobin: 10.5 — ABNORMAL LOW
MCHC: 33.7
MCHC: 34
Platelets: 260
RBC: 3.41 — ABNORMAL LOW
RDW: 14

## 2011-07-11 LAB — BASIC METABOLIC PANEL
BUN: 11
CO2: 34 — ABNORMAL HIGH
Calcium: 8.3 — ABNORMAL LOW
Creatinine, Ser: 0.98
Glucose, Bld: 84
Sodium: 142

## 2011-07-15 LAB — BASIC METABOLIC PANEL
BUN: 30 mg/dL — ABNORMAL HIGH (ref 6–23)
BUN: 31 mg/dL — ABNORMAL HIGH (ref 6–23)
CO2: 25 mEq/L (ref 19–32)
CO2: 26 mEq/L (ref 19–32)
CO2: 27 mEq/L (ref 19–32)
CO2: 28 mEq/L (ref 19–32)
Calcium: 7.7 mg/dL — ABNORMAL LOW (ref 8.4–10.5)
Calcium: 7.7 mg/dL — ABNORMAL LOW (ref 8.4–10.5)
Calcium: 7.8 mg/dL — ABNORMAL LOW (ref 8.4–10.5)
Chloride: 100 mEq/L (ref 96–112)
Chloride: 101 mEq/L (ref 96–112)
Chloride: 96 mEq/L (ref 96–112)
Creatinine, Ser: 1.56 mg/dL — ABNORMAL HIGH (ref 0.4–1.2)
Creatinine, Ser: 1.65 mg/dL — ABNORMAL HIGH (ref 0.4–1.2)
Creatinine, Ser: 1.72 mg/dL — ABNORMAL HIGH (ref 0.4–1.2)
GFR calc Af Amer: 34 mL/min — ABNORMAL LOW (ref >60)
GFR calc Af Amer: 35 mL/min — ABNORMAL LOW (ref >60)
GFR calc Af Amer: 35 mL/min — ABNORMAL LOW (ref >60)
GFR calc Af Amer: 37 mL/min — ABNORMAL LOW (ref >60)
GFR calc Af Amer: 39 mL/min — ABNORMAL LOW (ref >60)
GFR calc non Af Amer: 28 mL/min — ABNORMAL LOW (ref >60)
GFR calc non Af Amer: 29 mL/min — ABNORMAL LOW (ref >60)
GFR calc non Af Amer: 29 mL/min — ABNORMAL LOW (ref >60)
Glucose, Bld: 159 mg/dL — ABNORMAL HIGH (ref 70–99)
Glucose, Bld: 80 mg/dL (ref 70–99)
Glucose, Bld: 95 mg/dL (ref 70–99)
Potassium: 3.7 mEq/L (ref 3.5–5.1)
Potassium: 4.8 mEq/L (ref 3.5–5.1)
Potassium: 5.6 mEq/L — ABNORMAL HIGH (ref 3.5–5.1)
Potassium: 5.9 mEq/L — ABNORMAL HIGH (ref 3.5–5.1)
Sodium: 113 mEq/L — CL (ref 135–145)
Sodium: 124 mEq/L — ABNORMAL LOW (ref 135–145)
Sodium: 126 mEq/L — ABNORMAL LOW (ref 135–145)
Sodium: 126 mEq/L — ABNORMAL LOW (ref 135–145)
Sodium: 127 mEq/L — ABNORMAL LOW (ref 135–145)
Sodium: 127 mEq/L — ABNORMAL LOW (ref 135–145)

## 2011-07-15 LAB — COMPREHENSIVE METABOLIC PANEL
AST: 37 U/L (ref 0–37)
Albumin: 1.2 g/dL — ABNORMAL LOW (ref 3.5–5.2)
Alkaline Phosphatase: 97 U/L (ref 39–117)
BUN: 24 mg/dL — ABNORMAL HIGH (ref 6–23)
Chloride: 80 mEq/L — ABNORMAL LOW (ref 96–112)
GFR calc Af Amer: 39 mL/min — ABNORMAL LOW (ref >60)
Potassium: 4 mEq/L (ref 3.5–5.1)
Total Bilirubin: 0.4 mg/dL (ref 0.3–1.2)

## 2011-07-15 LAB — CBC
HCT: 27 % — ABNORMAL LOW (ref 36.0–46.0)
HCT: 31.8 % — ABNORMAL LOW (ref 36.0–46.0)
Hemoglobin: 9.2 g/dL — ABNORMAL LOW (ref 12.0–15.0)
Hemoglobin: 9.4 g/dL — ABNORMAL LOW (ref 12.0–15.0)
MCHC: 33.8 g/dL (ref 30.0–36.0)
MCV: 93 fL (ref 78.0–100.0)
MCV: 93.1 fL (ref 78.0–100.0)
Platelets: 235 10*3/uL (ref 150–400)
Platelets: 264 10*3/uL (ref 150–400)
RBC: 2.79 MIL/uL — ABNORMAL LOW (ref 3.87–5.11)
RBC: 2.88 MIL/uL — ABNORMAL LOW (ref 3.87–5.11)
RBC: 3.01 MIL/uL — ABNORMAL LOW (ref 3.87–5.11)
WBC: 5.1 10*3/uL (ref 4.0–10.5)
WBC: 5.8 10*3/uL (ref 4.0–10.5)
WBC: 6.6 10*3/uL (ref 4.0–10.5)

## 2011-07-15 LAB — B-NATRIURETIC PEPTIDE (CONVERTED LAB): Pro B Natriuretic peptide (BNP): 310 pg/mL — ABNORMAL HIGH (ref 0.0–100.0)

## 2011-07-15 LAB — URINALYSIS, ROUTINE W REFLEX MICROSCOPIC
Bilirubin Urine: NEGATIVE
Specific Gravity, Urine: 1.021 (ref 1.005–1.030)
pH: 6.5 (ref 5.0–8.0)

## 2011-07-15 LAB — SODIUM
Sodium: 120 mEq/L — ABNORMAL LOW (ref 135–145)
Sodium: 121 mEq/L — ABNORMAL LOW (ref 135–145)
Sodium: 121 mEq/L — ABNORMAL LOW (ref 135–145)

## 2011-07-15 LAB — CORTISOL-AM, BLOOD: Cortisol - AM: 9.6 ug/dL (ref 4.3–22.4)

## 2011-07-15 LAB — URINE MICROSCOPIC-ADD ON

## 2011-07-15 LAB — OSMOLALITY: Osmolality: 251 mOsm/kg — ABNORMAL LOW (ref 275–300)

## 2011-07-18 LAB — BASIC METABOLIC PANEL
BUN: 28 mg/dL — ABNORMAL HIGH (ref 6–23)
BUN: 34 mg/dL — ABNORMAL HIGH (ref 6–23)
CO2: 26 mEq/L (ref 19–32)
CO2: 27 mEq/L (ref 19–32)
Calcium: 7.8 mg/dL — ABNORMAL LOW (ref 8.4–10.5)
Calcium: 7.8 mg/dL — ABNORMAL LOW (ref 8.4–10.5)
Chloride: 100 mEq/L (ref 96–112)
Chloride: 98 mEq/L (ref 96–112)
Creatinine, Ser: 1.8 mg/dL — ABNORMAL HIGH (ref 0.4–1.2)
Creatinine, Ser: 1.86 mg/dL — ABNORMAL HIGH (ref 0.4–1.2)
GFR calc Af Amer: 32 mL/min — ABNORMAL LOW (ref >60)
GFR calc Af Amer: 33 mL/min — ABNORMAL LOW (ref >60)
GFR calc non Af Amer: 26 mL/min — ABNORMAL LOW (ref >60)
GFR calc non Af Amer: 28 mL/min — ABNORMAL LOW (ref >60)
Glucose, Bld: 90 mg/dL (ref 70–99)
Glucose, Bld: 92 mg/dL (ref 70–99)
Potassium: 3.7 mEq/L (ref 3.5–5.1)
Potassium: 3.8 mEq/L (ref 3.5–5.1)
Sodium: 131 mEq/L — ABNORMAL LOW (ref 135–145)
Sodium: 132 mEq/L — ABNORMAL LOW (ref 135–145)

## 2011-07-18 LAB — CBC
HCT: 25.4 % — ABNORMAL LOW (ref 36.0–46.0)
Hemoglobin: 8.7 g/dL — ABNORMAL LOW (ref 12.0–15.0)
MCHC: 34.4 g/dL (ref 30.0–36.0)
MCV: 93.8 fL (ref 78.0–100.0)
Platelets: 206 10*3/uL (ref 150–400)
RBC: 2.71 MIL/uL — ABNORMAL LOW (ref 3.87–5.11)
RDW: 13.5 % (ref 11.5–15.5)
WBC: 6.3 10*3/uL (ref 4.0–10.5)

## 2011-07-18 LAB — FOLATE: Folate: 4.5 ng/mL

## 2011-07-18 LAB — RETICULOCYTES
RBC.: 2.82 MIL/uL — ABNORMAL LOW (ref 3.87–5.11)
Retic Count, Absolute: 36.7 10*3/uL (ref 19.0–186.0)
Retic Ct Pct: 1.3 % (ref 0.4–3.1)

## 2011-07-18 LAB — COMPREHENSIVE METABOLIC PANEL
ALT: 12 U/L (ref 0–35)
Albumin: <1 g/dL — ABNORMAL LOW (ref 3.5–5.2)
Alkaline Phosphatase: 58 U/L (ref 39–117)
Potassium: 4 mEq/L (ref 3.5–5.1)
Sodium: 133 mEq/L — ABNORMAL LOW (ref 135–145)
Total Protein: 3.9 g/dL — ABNORMAL LOW (ref 6.0–8.3)

## 2011-07-18 LAB — VITAMIN B12: Vitamin B-12: 317 pg/mL (ref 211–911)

## 2011-07-18 LAB — FERRITIN: Ferritin: 111 ng/mL (ref 10–291)

## 2011-07-18 LAB — IRON AND TIBC: TIBC: 115 ug/dL — ABNORMAL LOW (ref 250–470)

## 2011-08-01 ENCOUNTER — Encounter (HOSPITAL_COMMUNITY)
Admission: RE | Admit: 2011-08-01 | Discharge: 2011-08-01 | Disposition: A | Discharge status: Home / Self Care | Payer: Medicare Other | Source: Ambulatory Visit | Attending: Nephrology | Admitting: Nephrology

## 2011-08-01 ENCOUNTER — Other Ambulatory Visit: Payer: Self-pay | Admitting: Nephrology

## 2011-08-01 DIAGNOSIS — N049 Nephrotic syndrome with unspecified morphologic changes: Secondary | ICD-10-CM | POA: Insufficient documentation

## 2011-08-01 DIAGNOSIS — D638 Anemia in other chronic diseases classified elsewhere: Secondary | ICD-10-CM | POA: Insufficient documentation

## 2011-08-01 DIAGNOSIS — N184 Chronic kidney disease, stage 4 (severe): Secondary | ICD-10-CM | POA: Insufficient documentation

## 2011-08-01 LAB — RENAL FUNCTION PANEL
Albumin: 1.7 g/dL — ABNORMAL LOW (ref 3.5–5.2)
BUN: 32 mg/dL — ABNORMAL HIGH (ref 6–23)
Calcium: 7.6 mg/dL — ABNORMAL LOW (ref 8.4–10.5)
Creatinine, Ser: 5.29 mg/dL — ABNORMAL HIGH (ref 0.50–1.10)
Phosphorus: 4.9 mg/dL — ABNORMAL HIGH (ref 2.3–4.6)
Potassium: 3.8 mEq/L (ref 3.5–5.1)

## 2011-08-01 LAB — POCT HEMOGLOBIN-HEMACUE: Hemoglobin: 10.7 g/dL — ABNORMAL LOW (ref 12.0–15.0)

## 2011-08-27 ENCOUNTER — Other Ambulatory Visit (HOSPITAL_COMMUNITY): Payer: Self-pay

## 2011-08-29 ENCOUNTER — Encounter (HOSPITAL_COMMUNITY)
Admission: RE | Admit: 2011-08-29 | Discharge: 2011-08-29 | Disposition: A | Discharge status: Home / Self Care | Payer: Medicare Other | Source: Ambulatory Visit | Attending: Nephrology | Admitting: Nephrology

## 2011-08-29 DIAGNOSIS — N049 Nephrotic syndrome with unspecified morphologic changes: Secondary | ICD-10-CM | POA: Insufficient documentation

## 2011-08-29 DIAGNOSIS — N184 Chronic kidney disease, stage 4 (severe): Secondary | ICD-10-CM | POA: Insufficient documentation

## 2011-08-29 DIAGNOSIS — D638 Anemia in other chronic diseases classified elsewhere: Secondary | ICD-10-CM | POA: Insufficient documentation

## 2011-08-29 LAB — IRON AND TIBC: Iron: 89 ug/dL (ref 42–135)

## 2011-08-29 MED ORDER — DARBEPOETIN ALFA-POLYSORBATE 500 MCG/ML IJ SOLN
40.0000 ug | INTRAMUSCULAR | Status: AC
  Administered 2011-08-29: 40 ug via SUBCUTANEOUS
  Filled 2011-08-29: qty 1

## 2011-08-29 MED ORDER — DARBEPOETIN ALFA-POLYSORBATE 40 MCG/0.4ML IJ SOLN
INTRAMUSCULAR | Status: AC
  Filled 2011-08-29: qty 0.4

## 2011-09-22 ENCOUNTER — Other Ambulatory Visit: Payer: Self-pay | Admitting: Nephrology

## 2011-09-26 ENCOUNTER — Encounter (HOSPITAL_COMMUNITY)
Admission: RE | Admit: 2011-09-26 | Discharge: 2011-09-26 | Disposition: A | Discharge status: Home / Self Care | Payer: Medicare Other | Source: Ambulatory Visit | Attending: Nephrology | Admitting: Nephrology

## 2011-09-26 DIAGNOSIS — N049 Nephrotic syndrome with unspecified morphologic changes: Secondary | ICD-10-CM | POA: Insufficient documentation

## 2011-09-26 DIAGNOSIS — N184 Chronic kidney disease, stage 4 (severe): Secondary | ICD-10-CM | POA: Insufficient documentation

## 2011-09-26 DIAGNOSIS — D638 Anemia in other chronic diseases classified elsewhere: Secondary | ICD-10-CM | POA: Insufficient documentation

## 2011-09-26 LAB — IRON AND TIBC
Iron: 62 ug/dL (ref 42–135)
Saturation Ratios: 31 % (ref 20–55)
TIBC: 200 ug/dL — ABNORMAL LOW (ref 250–470)
UIBC: 138 ug/dL (ref 125–400)

## 2011-09-26 LAB — RENAL FUNCTION PANEL
CO2: 21 mEq/L (ref 19–32)
Chloride: 113 mEq/L — ABNORMAL HIGH (ref 96–112)
GFR calc Af Amer: 7 mL/min — ABNORMAL LOW (ref >90)
Glucose, Bld: 84 mg/dL (ref 70–99)
Potassium: 4.2 mEq/L (ref 3.5–5.1)
Sodium: 141 mEq/L (ref 135–145)

## 2011-09-26 MED ORDER — DARBEPOETIN ALFA-POLYSORBATE 40 MCG/0.4ML IJ SOLN
INTRAMUSCULAR | Status: AC
  Administered 2011-09-26: 40 ug
  Filled 2011-09-26: qty 0.4

## 2011-09-26 MED ORDER — DARBEPOETIN ALFA-POLYSORBATE 500 MCG/ML IJ SOLN
40.0000 ug | INTRAMUSCULAR | Status: DC
  Filled 2011-09-26: qty 1

## 2011-10-24 ENCOUNTER — Encounter (HOSPITAL_COMMUNITY)
Admission: RE | Admit: 2011-10-24 | Discharge: 2011-10-24 | Disposition: A | Discharge status: Home / Self Care | Payer: Medicare Other | Source: Ambulatory Visit | Attending: Nephrology | Admitting: Nephrology

## 2011-10-24 DIAGNOSIS — D638 Anemia in other chronic diseases classified elsewhere: Secondary | ICD-10-CM | POA: Insufficient documentation

## 2011-10-24 DIAGNOSIS — N184 Chronic kidney disease, stage 4 (severe): Secondary | ICD-10-CM | POA: Insufficient documentation

## 2011-10-24 DIAGNOSIS — N049 Nephrotic syndrome with unspecified morphologic changes: Secondary | ICD-10-CM | POA: Insufficient documentation

## 2011-10-24 LAB — IRON AND TIBC
Iron: 71 ug/dL (ref 42–135)
UIBC: 131 ug/dL (ref 125–400)

## 2011-10-24 LAB — FERRITIN: Ferritin: 107 ng/mL (ref 10–291)

## 2011-10-24 LAB — RENAL FUNCTION PANEL
BUN: 33 mg/dL — ABNORMAL HIGH (ref 6–23)
Calcium: 8 mg/dL — ABNORMAL LOW (ref 8.4–10.5)
Creatinine, Ser: 5.1 mg/dL — ABNORMAL HIGH (ref 0.50–1.10)
Glucose, Bld: 98 mg/dL (ref 70–99)
Phosphorus: 4.1 mg/dL (ref 2.3–4.6)
Potassium: 4.4 mEq/L (ref 3.5–5.1)

## 2011-10-24 MED ORDER — DARBEPOETIN ALFA-POLYSORBATE 40 MCG/0.4ML IJ SOLN
INTRAMUSCULAR | Status: AC
  Administered 2011-10-24: 40 ug via SUBCUTANEOUS
  Filled 2011-10-24: qty 0.4

## 2011-10-24 MED ORDER — DARBEPOETIN ALFA-POLYSORBATE 40 MCG/0.4ML IJ SOLN: 40.0000 ug | INTRAMUSCULAR | Status: DC

## 2011-11-17 ENCOUNTER — Other Ambulatory Visit (HOSPITAL_COMMUNITY): Payer: Self-pay | Admitting: *Deleted

## 2011-11-21 ENCOUNTER — Encounter (HOSPITAL_COMMUNITY)
Admission: RE | Admit: 2011-11-21 | Discharge: 2011-11-21 | Disposition: A | Discharge status: Home / Self Care | Payer: Medicare Other | Source: Ambulatory Visit | Attending: Nephrology | Admitting: Nephrology

## 2011-11-21 DIAGNOSIS — N049 Nephrotic syndrome with unspecified morphologic changes: Secondary | ICD-10-CM | POA: Insufficient documentation

## 2011-11-21 DIAGNOSIS — D638 Anemia in other chronic diseases classified elsewhere: Secondary | ICD-10-CM | POA: Insufficient documentation

## 2011-11-21 DIAGNOSIS — N184 Chronic kidney disease, stage 4 (severe): Secondary | ICD-10-CM | POA: Insufficient documentation

## 2011-11-21 LAB — RENAL FUNCTION PANEL
BUN: 41 mg/dL — ABNORMAL HIGH (ref 6–23)
CO2: 18 mEq/L — ABNORMAL LOW (ref 19–32)
GFR calc Af Amer: 7 mL/min — ABNORMAL LOW (ref >90)
Glucose, Bld: 95 mg/dL (ref 70–99)
Potassium: 4.7 mEq/L (ref 3.5–5.1)
Sodium: 140 mEq/L (ref 135–145)

## 2011-11-21 LAB — IRON AND TIBC
Iron: 93 ug/dL (ref 42–135)
Saturation Ratios: 46 % (ref 20–55)
TIBC: 201 ug/dL — ABNORMAL LOW (ref 250–470)
UIBC: 108 ug/dL — ABNORMAL LOW (ref 125–400)

## 2011-11-21 LAB — FERRITIN: Ferritin: 105 ng/mL (ref 10–291)

## 2011-11-21 MED ORDER — DARBEPOETIN ALFA-POLYSORBATE 40 MCG/0.4ML IJ SOLN
INTRAMUSCULAR | Status: AC
  Administered 2011-11-21: 40 ug via SUBCUTANEOUS
  Filled 2011-11-21: qty 0.4

## 2011-11-21 MED ORDER — DARBEPOETIN ALFA-POLYSORBATE 500 MCG/ML IJ SOLN
40.0000 ug | INTRAMUSCULAR | Status: DC
  Filled 2011-11-21: qty 1

## 2011-12-19 ENCOUNTER — Encounter (HOSPITAL_COMMUNITY)
Admission: RE | Admit: 2011-12-19 | Discharge: 2011-12-19 | Disposition: A | Discharge status: Home / Self Care | Payer: Medicare Other | Source: Ambulatory Visit | Attending: Nephrology | Admitting: Nephrology

## 2011-12-19 DIAGNOSIS — N049 Nephrotic syndrome with unspecified morphologic changes: Secondary | ICD-10-CM | POA: Insufficient documentation

## 2011-12-19 DIAGNOSIS — N184 Chronic kidney disease, stage 4 (severe): Secondary | ICD-10-CM | POA: Insufficient documentation

## 2011-12-19 DIAGNOSIS — D638 Anemia in other chronic diseases classified elsewhere: Secondary | ICD-10-CM | POA: Insufficient documentation

## 2011-12-19 LAB — RENAL FUNCTION PANEL
BUN: 40 mg/dL — ABNORMAL HIGH (ref 6–23)
Calcium: 7.7 mg/dL — ABNORMAL LOW (ref 8.4–10.5)
Creatinine, Ser: 5.26 mg/dL — ABNORMAL HIGH (ref 0.50–1.10)
Glucose, Bld: 91 mg/dL (ref 70–99)
Phosphorus: 3.9 mg/dL (ref 2.3–4.6)

## 2011-12-19 LAB — IRON AND TIBC
Iron: 63 ug/dL (ref 42–135)
UIBC: 111 ug/dL — ABNORMAL LOW (ref 125–400)

## 2011-12-19 LAB — FERRITIN: Ferritin: 128 ng/mL (ref 10–291)

## 2011-12-19 MED ORDER — DARBEPOETIN ALFA-POLYSORBATE 500 MCG/ML IJ SOLN
40.0000 ug | INTRAMUSCULAR | Status: DC
  Administered 2011-12-19: 40 ug via SUBCUTANEOUS

## 2011-12-22 LAB — POCT HEMOGLOBIN-HEMACUE: Hemoglobin: 11 g/dL — ABNORMAL LOW (ref 12.0–15.0)

## 2011-12-22 MED FILL — Darbepoetin Alfa-Polysorbate 80 Soln Inj 40 MCG/0.4ML: INTRAMUSCULAR | Qty: 0.4 | Status: CN

## 2011-12-23 MED FILL — Darbepoetin Alfa-Polysorbate 80 Soln Inj 40 MCG/0.4ML: INTRAMUSCULAR | Qty: 0.4 | Status: AC

## 2011-12-23 MED FILL — Darbepoetin Alfa-Polysorbate 80 Soln Inj 40 MCG/0.4ML: INTRAMUSCULAR | Qty: 0.4 | Status: CN

## 2012-01-16 ENCOUNTER — Encounter (HOSPITAL_COMMUNITY)
Admission: RE | Admit: 2012-01-16 | Discharge: 2012-01-16 | Disposition: A | Discharge status: Home / Self Care | Payer: Medicare Other | Source: Ambulatory Visit | Attending: Nephrology | Admitting: Nephrology

## 2012-01-16 DIAGNOSIS — D638 Anemia in other chronic diseases classified elsewhere: Secondary | ICD-10-CM | POA: Insufficient documentation

## 2012-01-16 DIAGNOSIS — N184 Chronic kidney disease, stage 4 (severe): Secondary | ICD-10-CM | POA: Insufficient documentation

## 2012-01-16 DIAGNOSIS — N049 Nephrotic syndrome with unspecified morphologic changes: Secondary | ICD-10-CM | POA: Insufficient documentation

## 2012-01-16 LAB — RENAL FUNCTION PANEL
Albumin: 2.1 g/dL — ABNORMAL LOW (ref 3.5–5.2)
BUN: 39 mg/dL — ABNORMAL HIGH (ref 6–23)
GFR calc non Af Amer: 7 mL/min — ABNORMAL LOW (ref >90)
Phosphorus: 3.8 mg/dL (ref 2.3–4.6)
Potassium: 3.8 mEq/L (ref 3.5–5.1)
Sodium: 139 mEq/L (ref 135–145)

## 2012-01-16 LAB — FERRITIN: Ferritin: 115 ng/mL (ref 10–291)

## 2012-01-16 LAB — IRON AND TIBC: Iron: 93 ug/dL (ref 42–135)

## 2012-01-16 LAB — POCT HEMOGLOBIN-HEMACUE: Hemoglobin: 10.8 g/dL — ABNORMAL LOW (ref 12.0–15.0)

## 2012-01-16 MED ORDER — DARBEPOETIN ALFA-POLYSORBATE 40 MCG/0.4ML IJ SOLN
40.0000 ug | INTRAMUSCULAR | Status: DC
  Administered 2012-01-16: 40 ug via INTRAVENOUS

## 2012-01-16 MED ORDER — DARBEPOETIN ALFA-POLYSORBATE 40 MCG/0.4ML IJ SOLN
INTRAMUSCULAR | Status: AC
  Filled 2012-01-16: qty 0.4

## 2012-02-12 ENCOUNTER — Other Ambulatory Visit (HOSPITAL_COMMUNITY): Payer: Self-pay | Admitting: *Deleted

## 2012-02-13 ENCOUNTER — Encounter (HOSPITAL_COMMUNITY)
Admission: RE | Admit: 2012-02-13 | Discharge: 2012-02-13 | Disposition: A | Discharge status: Home / Self Care | Payer: Medicare Other | Source: Ambulatory Visit | Attending: Nephrology | Admitting: Nephrology

## 2012-02-13 DIAGNOSIS — D638 Anemia in other chronic diseases classified elsewhere: Secondary | ICD-10-CM | POA: Insufficient documentation

## 2012-02-13 DIAGNOSIS — N184 Chronic kidney disease, stage 4 (severe): Secondary | ICD-10-CM | POA: Insufficient documentation

## 2012-02-13 DIAGNOSIS — N049 Nephrotic syndrome with unspecified morphologic changes: Secondary | ICD-10-CM | POA: Insufficient documentation

## 2012-02-13 LAB — RENAL FUNCTION PANEL
Albumin: 2.2 g/dL — ABNORMAL LOW (ref 3.5–5.2)
Chloride: 113 mEq/L — ABNORMAL HIGH (ref 96–112)
Creatinine, Ser: 6.33 mg/dL — ABNORMAL HIGH (ref 0.50–1.10)
GFR calc Af Amer: 7 mL/min — ABNORMAL LOW (ref >90)
GFR calc non Af Amer: 6 mL/min — ABNORMAL LOW (ref >90)
Potassium: 4.5 mEq/L (ref 3.5–5.1)
Sodium: 137 mEq/L (ref 135–145)

## 2012-02-13 LAB — POCT HEMOGLOBIN-HEMACUE: Hemoglobin: 10.3 g/dL — ABNORMAL LOW (ref 12.0–15.0)

## 2012-02-13 MED ORDER — DARBEPOETIN ALFA-POLYSORBATE 40 MCG/0.4ML IJ SOLN
40.0000 ug | INTRAMUSCULAR | Status: DC
  Administered 2012-02-13: 40 ug via INTRAVENOUS
  Filled 2012-02-13: qty 0.4

## 2012-02-14 LAB — IRON AND TIBC
Iron: 79 ug/dL (ref 42–135)
TIBC: 207 ug/dL — ABNORMAL LOW (ref 250–470)

## 2012-03-12 ENCOUNTER — Encounter (HOSPITAL_COMMUNITY)
Admission: RE | Admit: 2012-03-12 | Discharge: 2012-03-12 | Disposition: A | Discharge status: Home / Self Care | Payer: Medicare Other | Source: Ambulatory Visit | Attending: Nephrology | Admitting: Nephrology

## 2012-03-12 LAB — IRON AND TIBC
Saturation Ratios: 28 % (ref 20–55)
TIBC: 193 ug/dL — ABNORMAL LOW (ref 250–470)

## 2012-03-12 LAB — RENAL FUNCTION PANEL
CO2: 18 mEq/L — ABNORMAL LOW (ref 19–32)
Chloride: 109 mEq/L (ref 96–112)
Creatinine, Ser: 6.61 mg/dL — ABNORMAL HIGH (ref 0.50–1.10)
GFR calc Af Amer: 6 mL/min — ABNORMAL LOW (ref >90)
GFR calc non Af Amer: 5 mL/min — ABNORMAL LOW (ref >90)

## 2012-03-12 LAB — POCT HEMOGLOBIN-HEMACUE: Hemoglobin: 10.2 g/dL — ABNORMAL LOW (ref 12.0–15.0)

## 2012-03-12 MED ORDER — DARBEPOETIN ALFA-POLYSORBATE 40 MCG/0.4ML IJ SOLN
40.0000 ug | INTRAMUSCULAR | Status: DC
  Administered 2012-03-12: 40 ug via INTRAVENOUS

## 2012-03-12 MED ORDER — DARBEPOETIN ALFA-POLYSORBATE 40 MCG/0.4ML IJ SOLN
INTRAMUSCULAR | Status: AC
  Filled 2012-03-12: qty 0.4

## 2012-04-09 ENCOUNTER — Encounter (HOSPITAL_COMMUNITY)
Admission: RE | Admit: 2012-04-09 | Discharge: 2012-04-09 | Disposition: A | Discharge status: Home / Self Care | Payer: Medicare Other | Source: Ambulatory Visit | Attending: Nephrology | Admitting: Nephrology

## 2012-04-09 DIAGNOSIS — N049 Nephrotic syndrome with unspecified morphologic changes: Secondary | ICD-10-CM | POA: Insufficient documentation

## 2012-04-09 DIAGNOSIS — N184 Chronic kidney disease, stage 4 (severe): Secondary | ICD-10-CM | POA: Insufficient documentation

## 2012-04-09 DIAGNOSIS — D638 Anemia in other chronic diseases classified elsewhere: Secondary | ICD-10-CM | POA: Insufficient documentation

## 2012-04-09 LAB — RENAL FUNCTION PANEL
Albumin: 2.2 g/dL — ABNORMAL LOW (ref 3.5–5.2)
BUN: 40 mg/dL — ABNORMAL HIGH (ref 6–23)
Creatinine, Ser: 5.92 mg/dL — ABNORMAL HIGH (ref 0.50–1.10)
GFR calc non Af Amer: 6 mL/min — ABNORMAL LOW (ref >90)
Phosphorus: 4.1 mg/dL (ref 2.3–4.6)
Potassium: 4.6 mEq/L (ref 3.5–5.1)

## 2012-04-09 LAB — POCT HEMOGLOBIN-HEMACUE: Hemoglobin: 10.8 g/dL — ABNORMAL LOW (ref 12.0–15.0)

## 2012-04-09 LAB — FERRITIN: Ferritin: 109 ng/mL (ref 10–291)

## 2012-04-09 MED ORDER — DARBEPOETIN ALFA-POLYSORBATE 40 MCG/0.4ML IJ SOLN
40.0000 ug | INTRAMUSCULAR | Status: DC
  Administered 2012-04-09: 40 ug via INTRAVENOUS
  Filled 2012-04-09: qty 0.4

## 2012-05-04 ENCOUNTER — Other Ambulatory Visit (HOSPITAL_COMMUNITY): Payer: Self-pay | Admitting: *Deleted

## 2012-05-07 ENCOUNTER — Encounter (HOSPITAL_COMMUNITY)
Admission: RE | Admit: 2012-05-07 | Discharge: 2012-05-07 | Disposition: A | Discharge status: Home / Self Care | Payer: Medicare Other | Source: Ambulatory Visit | Attending: Nephrology | Admitting: Nephrology

## 2012-05-07 DIAGNOSIS — D638 Anemia in other chronic diseases classified elsewhere: Secondary | ICD-10-CM | POA: Insufficient documentation

## 2012-05-07 DIAGNOSIS — N049 Nephrotic syndrome with unspecified morphologic changes: Secondary | ICD-10-CM | POA: Insufficient documentation

## 2012-05-07 DIAGNOSIS — N184 Chronic kidney disease, stage 4 (severe): Secondary | ICD-10-CM | POA: Insufficient documentation

## 2012-05-07 LAB — RENAL FUNCTION PANEL
Albumin: 2.2 g/dL — ABNORMAL LOW (ref 3.5–5.2)
CO2: 17 mEq/L — ABNORMAL LOW (ref 19–32)
Chloride: 113 mEq/L — ABNORMAL HIGH (ref 96–112)
GFR calc Af Amer: 7 mL/min — ABNORMAL LOW (ref >90)
GFR calc non Af Amer: 6 mL/min — ABNORMAL LOW (ref >90)
Potassium: 4.7 mEq/L (ref 3.5–5.1)

## 2012-05-07 MED ORDER — DARBEPOETIN ALFA-POLYSORBATE 25 MCG/0.42ML IJ SOLN
INTRAMUSCULAR | Status: AC
  Filled 2012-05-07: qty 0.42

## 2012-05-07 MED ORDER — DARBEPOETIN ALFA-POLYSORBATE 40 MCG/0.4ML IJ SOLN
40.0000 ug | INTRAMUSCULAR | Status: DC
  Administered 2012-05-07: 40 ug via INTRAVENOUS

## 2012-05-08 LAB — IRON AND TIBC
Iron: 89 ug/dL (ref 42–135)
Saturation Ratios: 39 % (ref 20–55)
TIBC: 229 ug/dL — ABNORMAL LOW (ref 250–470)

## 2012-06-04 ENCOUNTER — Encounter (HOSPITAL_COMMUNITY)
Admission: RE | Admit: 2012-06-04 | Discharge: 2012-06-04 | Disposition: A | Discharge status: Home / Self Care | Payer: Medicare Other | Source: Ambulatory Visit | Attending: Nephrology | Admitting: Nephrology

## 2012-06-04 DIAGNOSIS — N049 Nephrotic syndrome with unspecified morphologic changes: Secondary | ICD-10-CM | POA: Insufficient documentation

## 2012-06-04 DIAGNOSIS — N184 Chronic kidney disease, stage 4 (severe): Secondary | ICD-10-CM | POA: Insufficient documentation

## 2012-06-04 DIAGNOSIS — D638 Anemia in other chronic diseases classified elsewhere: Secondary | ICD-10-CM | POA: Insufficient documentation

## 2012-06-04 LAB — IRON AND TIBC
Saturation Ratios: 30 % (ref 20–55)
TIBC: 230 ug/dL — ABNORMAL LOW (ref 250–470)
UIBC: 162 ug/dL (ref 125–400)

## 2012-06-04 LAB — RENAL FUNCTION PANEL
BUN: 45 mg/dL — ABNORMAL HIGH (ref 6–23)
CO2: 20 mEq/L (ref 19–32)
Chloride: 111 mEq/L (ref 96–112)
Creatinine, Ser: 5.98 mg/dL — ABNORMAL HIGH (ref 0.50–1.10)
GFR calc non Af Amer: 6 mL/min — ABNORMAL LOW (ref >90)
Glucose, Bld: 91 mg/dL (ref 70–99)

## 2012-06-04 LAB — POCT HEMOGLOBIN-HEMACUE: Hemoglobin: 10.8 g/dL — ABNORMAL LOW (ref 12.0–15.0)

## 2012-06-04 MED ORDER — DARBEPOETIN ALFA-POLYSORBATE 40 MCG/0.4ML IJ SOLN
40.0000 ug | INTRAMUSCULAR | Status: DC
  Administered 2012-06-04: 40 ug via INTRAVENOUS

## 2012-06-04 MED ORDER — DARBEPOETIN ALFA-POLYSORBATE 40 MCG/0.4ML IJ SOLN
INTRAMUSCULAR | Status: AC
  Filled 2012-06-04: qty 0.4

## 2012-06-07 LAB — PTH, INTACT AND CALCIUM
Calcium, Total (PTH): 7.8 mg/dL — ABNORMAL LOW (ref 8.4–10.5)
PTH: 772.5 pg/mL — ABNORMAL HIGH (ref 14.0–72.0)

## 2012-07-01 ENCOUNTER — Other Ambulatory Visit (HOSPITAL_COMMUNITY): Payer: Self-pay | Admitting: *Deleted

## 2012-07-02 ENCOUNTER — Encounter (HOSPITAL_COMMUNITY)
Admission: RE | Admit: 2012-07-02 | Discharge: 2012-07-02 | Disposition: A | Discharge status: Home / Self Care | Payer: Medicare Other | Source: Ambulatory Visit | Attending: Nephrology | Admitting: Nephrology

## 2012-07-02 DIAGNOSIS — N184 Chronic kidney disease, stage 4 (severe): Secondary | ICD-10-CM | POA: Insufficient documentation

## 2012-07-02 DIAGNOSIS — D638 Anemia in other chronic diseases classified elsewhere: Secondary | ICD-10-CM | POA: Insufficient documentation

## 2012-07-02 DIAGNOSIS — N049 Nephrotic syndrome with unspecified morphologic changes: Secondary | ICD-10-CM | POA: Insufficient documentation

## 2012-07-02 LAB — IRON AND TIBC
Iron: 81 ug/dL (ref 42–135)
TIBC: 212 ug/dL — ABNORMAL LOW (ref 250–470)

## 2012-07-02 LAB — RENAL FUNCTION PANEL
Albumin: 2.4 g/dL — ABNORMAL LOW (ref 3.5–5.2)
Chloride: 113 mEq/L — ABNORMAL HIGH (ref 96–112)
GFR calc non Af Amer: 6 mL/min — ABNORMAL LOW (ref >90)
Phosphorus: 4.7 mg/dL — ABNORMAL HIGH (ref 2.3–4.6)
Potassium: 4.4 mEq/L (ref 3.5–5.1)

## 2012-07-02 MED ORDER — DARBEPOETIN ALFA-POLYSORBATE 40 MCG/0.4ML IJ SOLN
40.0000 ug | INTRAMUSCULAR | Status: DC
  Administered 2012-07-02: 40 ug via INTRAVENOUS
  Filled 2012-07-02: qty 0.4

## 2012-07-03 LAB — VITAMIN D 25 HYDROXY (VIT D DEFICIENCY, FRACTURES): Vit D, 25-Hydroxy: 17 ng/mL — ABNORMAL LOW (ref 30–89)

## 2012-07-29 ENCOUNTER — Other Ambulatory Visit (HOSPITAL_COMMUNITY): Payer: Self-pay | Admitting: *Deleted

## 2012-07-30 ENCOUNTER — Encounter (HOSPITAL_COMMUNITY)
Admission: RE | Admit: 2012-07-30 | Discharge: 2012-07-30 | Disposition: A | Discharge status: Home / Self Care | Payer: Medicare Other | Source: Ambulatory Visit | Attending: Nephrology | Admitting: Nephrology

## 2012-07-30 DIAGNOSIS — N184 Chronic kidney disease, stage 4 (severe): Secondary | ICD-10-CM | POA: Insufficient documentation

## 2012-07-30 DIAGNOSIS — D638 Anemia in other chronic diseases classified elsewhere: Secondary | ICD-10-CM | POA: Insufficient documentation

## 2012-07-30 DIAGNOSIS — N049 Nephrotic syndrome with unspecified morphologic changes: Secondary | ICD-10-CM | POA: Insufficient documentation

## 2012-07-30 LAB — POCT HEMOGLOBIN-HEMACUE: Hemoglobin: 10.3 g/dL — ABNORMAL LOW (ref 12.0–15.0)

## 2012-07-30 MED ORDER — DARBEPOETIN ALFA-POLYSORBATE 40 MCG/0.4ML IJ SOLN
INTRAMUSCULAR | Status: AC
  Administered 2012-07-30: 40 ug via SUBCUTANEOUS
  Filled 2012-07-30: qty 0.4

## 2012-07-30 MED ORDER — DARBEPOETIN ALFA-POLYSORBATE 40 MCG/0.4ML IJ SOLN: 40.0000 ug | INTRAMUSCULAR | Status: DC

## 2012-08-27 ENCOUNTER — Encounter (HOSPITAL_COMMUNITY)
Admission: RE | Admit: 2012-08-27 | Discharge: 2012-08-27 | Disposition: A | Discharge status: Home / Self Care | Payer: Medicare Other | Source: Ambulatory Visit | Attending: Nephrology | Admitting: Nephrology

## 2012-08-27 DIAGNOSIS — N049 Nephrotic syndrome with unspecified morphologic changes: Secondary | ICD-10-CM | POA: Insufficient documentation

## 2012-08-27 DIAGNOSIS — D638 Anemia in other chronic diseases classified elsewhere: Secondary | ICD-10-CM | POA: Insufficient documentation

## 2012-08-27 DIAGNOSIS — N184 Chronic kidney disease, stage 4 (severe): Secondary | ICD-10-CM | POA: Insufficient documentation

## 2012-08-27 LAB — RENAL FUNCTION PANEL
GFR calc Af Amer: 6 mL/min — ABNORMAL LOW (ref >90)
GFR calc non Af Amer: 5 mL/min — ABNORMAL LOW (ref >90)
Glucose, Bld: 78 mg/dL (ref 70–99)
Phosphorus: 4.8 mg/dL — ABNORMAL HIGH (ref 2.3–4.6)
Potassium: 3.4 mEq/L — ABNORMAL LOW (ref 3.5–5.1)
Sodium: 141 mEq/L (ref 135–145)

## 2012-08-27 MED ORDER — DARBEPOETIN ALFA-POLYSORBATE 40 MCG/0.4ML IJ SOLN: 40.0000 ug | INTRAMUSCULAR | Status: DC

## 2012-08-27 MED ORDER — DARBEPOETIN ALFA-POLYSORBATE 40 MCG/0.4ML IJ SOLN
INTRAMUSCULAR | Status: AC
  Administered 2012-08-27: 40 ug via SUBCUTANEOUS
  Filled 2012-08-27: qty 0.4

## 2012-08-28 LAB — IRON AND TIBC
Iron: 73 ug/dL (ref 42–135)
Saturation Ratios: 29 % (ref 20–55)
TIBC: 251 ug/dL (ref 250–470)

## 2012-09-24 ENCOUNTER — Encounter (HOSPITAL_COMMUNITY)
Admission: RE | Admit: 2012-09-24 | Discharge: 2012-09-24 | Disposition: A | Discharge status: Home / Self Care | Payer: Medicare Other | Source: Ambulatory Visit | Attending: Nephrology | Admitting: Nephrology

## 2012-09-24 DIAGNOSIS — N184 Chronic kidney disease, stage 4 (severe): Secondary | ICD-10-CM | POA: Insufficient documentation

## 2012-09-24 DIAGNOSIS — N049 Nephrotic syndrome with unspecified morphologic changes: Secondary | ICD-10-CM | POA: Insufficient documentation

## 2012-09-24 DIAGNOSIS — D638 Anemia in other chronic diseases classified elsewhere: Secondary | ICD-10-CM | POA: Insufficient documentation

## 2012-09-24 LAB — FERRITIN: Ferritin: 124 ng/mL (ref 10–291)

## 2012-09-24 LAB — RENAL FUNCTION PANEL
BUN: 66 mg/dL — ABNORMAL HIGH (ref 6–23)
CO2: 19 mEq/L (ref 19–32)
Calcium: 8.4 mg/dL (ref 8.4–10.5)
Chloride: 104 mEq/L (ref 96–112)
Creatinine, Ser: 7.53 mg/dL — ABNORMAL HIGH (ref 0.50–1.10)
GFR calc non Af Amer: 5 mL/min — ABNORMAL LOW (ref >90)
Glucose, Bld: 98 mg/dL (ref 70–99)

## 2012-09-24 LAB — IRON AND TIBC
Saturation Ratios: 31 % (ref 20–55)
TIBC: 276 ug/dL (ref 250–470)
UIBC: 191 ug/dL (ref 125–400)

## 2012-09-24 MED ORDER — DARBEPOETIN ALFA-POLYSORBATE 40 MCG/0.4ML IJ SOLN
40.0000 ug | INTRAMUSCULAR | Status: DC
  Administered 2012-09-24: 40 ug via INTRAVENOUS

## 2012-09-24 MED ORDER — DARBEPOETIN ALFA-POLYSORBATE 40 MCG/0.4ML IJ SOLN
INTRAMUSCULAR | Status: DC
Start: 2012-09-24 — End: 2012-09-25
  Filled 2012-09-24: qty 0.4

## 2012-09-27 LAB — POCT HEMOGLOBIN-HEMACUE: Hemoglobin: 10.7 g/dL — ABNORMAL LOW (ref 12.0–15.0)

## 2012-10-22 ENCOUNTER — Encounter (HOSPITAL_COMMUNITY)
Admission: RE | Admit: 2012-10-22 | Discharge: 2012-10-22 | Disposition: A | Discharge status: Home / Self Care | Payer: Medicare Other | Source: Ambulatory Visit | Attending: Nephrology | Admitting: Nephrology

## 2012-10-22 DIAGNOSIS — N049 Nephrotic syndrome with unspecified morphologic changes: Secondary | ICD-10-CM | POA: Insufficient documentation

## 2012-10-22 DIAGNOSIS — D638 Anemia in other chronic diseases classified elsewhere: Secondary | ICD-10-CM | POA: Insufficient documentation

## 2012-10-22 DIAGNOSIS — N184 Chronic kidney disease, stage 4 (severe): Secondary | ICD-10-CM | POA: Insufficient documentation

## 2012-10-22 LAB — RENAL FUNCTION PANEL
CO2: 19 mEq/L (ref 19–32)
Calcium: 8.9 mg/dL (ref 8.4–10.5)
Creatinine, Ser: 7.07 mg/dL — ABNORMAL HIGH (ref 0.50–1.10)
GFR calc non Af Amer: 5 mL/min — ABNORMAL LOW (ref >90)
Phosphorus: 4.1 mg/dL (ref 2.3–4.6)
Sodium: 143 mEq/L (ref 135–145)

## 2012-10-22 LAB — IRON AND TIBC: Saturation Ratios: 39 % (ref 20–55)

## 2012-10-22 LAB — POCT HEMOGLOBIN-HEMACUE: Hemoglobin: 10.6 g/dL — ABNORMAL LOW (ref 12.0–15.0)

## 2012-10-22 MED ORDER — DARBEPOETIN ALFA-POLYSORBATE 40 MCG/0.4ML IJ SOLN
40.0000 ug | INTRAMUSCULAR | Status: DC
  Administered 2012-10-22: 40 ug via INTRAVENOUS

## 2012-10-22 MED ORDER — DARBEPOETIN ALFA-POLYSORBATE 40 MCG/0.4ML IJ SOLN
INTRAMUSCULAR | Status: AC
  Filled 2012-10-22: qty 0.4

## 2012-11-18 ENCOUNTER — Other Ambulatory Visit (HOSPITAL_COMMUNITY): Payer: Self-pay | Admitting: *Deleted

## 2012-11-19 ENCOUNTER — Encounter (HOSPITAL_COMMUNITY)
Admission: RE | Admit: 2012-11-19 | Discharge: 2012-11-19 | Disposition: A | Discharge status: Home / Self Care | Payer: Medicare Other | Source: Ambulatory Visit | Attending: Nephrology | Admitting: Nephrology

## 2012-11-19 DIAGNOSIS — N049 Nephrotic syndrome with unspecified morphologic changes: Secondary | ICD-10-CM | POA: Insufficient documentation

## 2012-11-19 DIAGNOSIS — D638 Anemia in other chronic diseases classified elsewhere: Secondary | ICD-10-CM | POA: Insufficient documentation

## 2012-11-19 DIAGNOSIS — N184 Chronic kidney disease, stage 4 (severe): Secondary | ICD-10-CM | POA: Insufficient documentation

## 2012-11-19 LAB — RENAL FUNCTION PANEL
Calcium: 8.2 mg/dL — ABNORMAL LOW (ref 8.4–10.5)
Creatinine, Ser: 7.04 mg/dL — ABNORMAL HIGH (ref 0.50–1.10)
GFR calc Af Amer: 6 mL/min — ABNORMAL LOW (ref >90)
GFR calc non Af Amer: 5 mL/min — ABNORMAL LOW (ref >90)
Phosphorus: 4.6 mg/dL (ref 2.3–4.6)
Sodium: 138 mEq/L (ref 135–145)

## 2012-11-19 LAB — POCT HEMOGLOBIN-HEMACUE: Hemoglobin: 10.3 g/dL — ABNORMAL LOW (ref 12.0–15.0)

## 2012-11-19 MED ORDER — DARBEPOETIN ALFA-POLYSORBATE 40 MCG/0.4ML IJ SOLN
40.0000 ug | INTRAMUSCULAR | Status: DC
  Administered 2012-11-19: 40 ug via INTRAVENOUS

## 2012-11-19 MED ORDER — DARBEPOETIN ALFA-POLYSORBATE 40 MCG/0.4ML IJ SOLN
INTRAMUSCULAR | Status: AC
  Filled 2012-11-19: qty 0.4

## 2012-12-17 ENCOUNTER — Inpatient Hospital Stay (HOSPITAL_COMMUNITY): Admission: RE | Admit: 2012-12-17 | Payer: Medicare Other | Source: Ambulatory Visit

## 2012-12-20 ENCOUNTER — Encounter (HOSPITAL_COMMUNITY)
Admission: RE | Admit: 2012-12-20 | Discharge: 2012-12-20 | Disposition: A | Discharge status: Home / Self Care | Payer: Medicare Other | Source: Ambulatory Visit | Attending: Nephrology | Admitting: Nephrology

## 2012-12-20 DIAGNOSIS — N049 Nephrotic syndrome with unspecified morphologic changes: Secondary | ICD-10-CM | POA: Insufficient documentation

## 2012-12-20 DIAGNOSIS — N184 Chronic kidney disease, stage 4 (severe): Secondary | ICD-10-CM | POA: Insufficient documentation

## 2012-12-20 DIAGNOSIS — D638 Anemia in other chronic diseases classified elsewhere: Secondary | ICD-10-CM | POA: Insufficient documentation

## 2012-12-20 LAB — RENAL FUNCTION PANEL
CO2: 20 mEq/L (ref 19–32)
GFR calc Af Amer: 5 mL/min — ABNORMAL LOW (ref >90)
GFR calc non Af Amer: 4 mL/min — ABNORMAL LOW (ref >90)
Glucose, Bld: 103 mg/dL — ABNORMAL HIGH (ref 70–99)
Phosphorus: 4.5 mg/dL (ref 2.3–4.6)
Potassium: 3.3 mEq/L — ABNORMAL LOW (ref 3.5–5.1)
Sodium: 142 mEq/L (ref 135–145)

## 2012-12-20 LAB — IRON AND TIBC
Iron: 80 ug/dL (ref 42–135)
Saturation Ratios: 31 % (ref 20–55)
UIBC: 181 ug/dL (ref 125–400)

## 2012-12-20 MED ORDER — DARBEPOETIN ALFA-POLYSORBATE 40 MCG/0.4ML IJ SOLN
40.0000 ug | INTRAMUSCULAR | Status: DC
  Administered 2012-12-20: 40 ug via INTRAVENOUS

## 2012-12-20 MED ORDER — DARBEPOETIN ALFA-POLYSORBATE 40 MCG/0.4ML IJ SOLN
INTRAMUSCULAR | Status: AC
  Filled 2012-12-20: qty 0.4

## 2013-01-14 ENCOUNTER — Encounter (HOSPITAL_COMMUNITY)
Admission: RE | Admit: 2013-01-14 | Discharge: 2013-01-14 | Disposition: A | Discharge status: Home / Self Care | Payer: Medicare Other | Source: Ambulatory Visit | Attending: Nephrology | Admitting: Nephrology

## 2013-01-14 DIAGNOSIS — N049 Nephrotic syndrome with unspecified morphologic changes: Secondary | ICD-10-CM | POA: Insufficient documentation

## 2013-01-14 DIAGNOSIS — D638 Anemia in other chronic diseases classified elsewhere: Secondary | ICD-10-CM | POA: Insufficient documentation

## 2013-01-14 DIAGNOSIS — N184 Chronic kidney disease, stage 4 (severe): Secondary | ICD-10-CM | POA: Insufficient documentation

## 2013-01-14 LAB — RENAL FUNCTION PANEL
BUN: 70 mg/dL — ABNORMAL HIGH (ref 6–23)
CO2: 19 mEq/L (ref 19–32)
GFR calc Af Amer: 4 mL/min — ABNORMAL LOW (ref >90)
Glucose, Bld: 77 mg/dL (ref 70–99)
Phosphorus: 5.1 mg/dL — ABNORMAL HIGH (ref 2.3–4.6)
Potassium: 2.6 mEq/L — CL (ref 3.5–5.1)
Sodium: 138 mEq/L (ref 135–145)

## 2013-01-14 LAB — FERRITIN: Ferritin: 148 ng/mL (ref 10–291)

## 2013-01-14 LAB — IRON AND TIBC
Iron: 85 ug/dL (ref 42–135)
Saturation Ratios: 35 % (ref 20–55)
UIBC: 159 ug/dL (ref 125–400)

## 2013-01-14 LAB — POCT HEMOGLOBIN-HEMACUE: Hemoglobin: 11.6 g/dL — ABNORMAL LOW (ref 12.0–15.0)

## 2013-01-14 MED ORDER — DARBEPOETIN ALFA-POLYSORBATE 40 MCG/0.4ML IJ SOLN
40.0000 ug | INTRAMUSCULAR | Status: DC
  Administered 2013-01-14: 40 ug via INTRAVENOUS

## 2013-01-14 MED ORDER — DARBEPOETIN ALFA-POLYSORBATE 25 MCG/0.42ML IJ SOLN
INTRAMUSCULAR | Status: AC
  Filled 2013-01-14: qty 0.42

## 2013-02-10 ENCOUNTER — Other Ambulatory Visit (HOSPITAL_COMMUNITY): Payer: Self-pay | Admitting: *Deleted

## 2013-02-11 ENCOUNTER — Encounter (HOSPITAL_COMMUNITY)
Admission: RE | Admit: 2013-02-11 | Discharge: 2013-02-11 | Disposition: A | Discharge status: Home / Self Care | Payer: Medicare Other | Source: Ambulatory Visit | Attending: Nephrology | Admitting: Nephrology

## 2013-02-11 DIAGNOSIS — D638 Anemia in other chronic diseases classified elsewhere: Secondary | ICD-10-CM | POA: Insufficient documentation

## 2013-02-11 DIAGNOSIS — N184 Chronic kidney disease, stage 4 (severe): Secondary | ICD-10-CM | POA: Insufficient documentation

## 2013-02-11 DIAGNOSIS — N049 Nephrotic syndrome with unspecified morphologic changes: Secondary | ICD-10-CM | POA: Insufficient documentation

## 2013-02-11 LAB — RENAL FUNCTION PANEL
Albumin: 2.9 g/dL — ABNORMAL LOW (ref 3.5–5.2)
Chloride: 105 mEq/L (ref 96–112)
GFR calc non Af Amer: 4 mL/min — ABNORMAL LOW (ref >90)
Potassium: 2.9 mEq/L — ABNORMAL LOW (ref 3.5–5.1)

## 2013-02-11 MED ORDER — DARBEPOETIN ALFA-POLYSORBATE 40 MCG/0.4ML IJ SOLN
40.0000 ug | INTRAMUSCULAR | Status: DC
  Administered 2013-02-11: 40 ug via INTRAVENOUS

## 2013-02-11 MED ORDER — DARBEPOETIN ALFA-POLYSORBATE 40 MCG/0.4ML IJ SOLN
INTRAMUSCULAR | Status: AC
  Filled 2013-02-11: qty 0.4

## 2013-02-12 LAB — IRON AND TIBC
Iron: 77 ug/dL (ref 42–135)
TIBC: 251 ug/dL (ref 250–470)

## 2013-03-11 ENCOUNTER — Encounter (HOSPITAL_COMMUNITY)
Admission: RE | Admit: 2013-03-11 | Discharge: 2013-03-11 | Disposition: A | Discharge status: Home / Self Care | Payer: Medicare Other | Source: Ambulatory Visit | Attending: Nephrology | Admitting: Nephrology

## 2013-03-11 MED ORDER — DARBEPOETIN ALFA-POLYSORBATE 40 MCG/0.4ML IJ SOLN
INTRAMUSCULAR | Status: AC
  Administered 2013-03-11: 40 ug via SUBCUTANEOUS
  Filled 2013-03-11: qty 0.4

## 2013-03-11 MED ORDER — DARBEPOETIN ALFA-POLYSORBATE 40 MCG/0.4ML IJ SOLN: 40.0000 ug | INTRAMUSCULAR | Status: DC

## 2013-04-08 ENCOUNTER — Encounter (HOSPITAL_COMMUNITY)
Admission: RE | Admit: 2013-04-08 | Discharge: 2013-04-08 | Disposition: A | Discharge status: Home / Self Care | Payer: Medicare Other | Source: Ambulatory Visit | Attending: Nephrology | Admitting: Nephrology

## 2013-04-08 DIAGNOSIS — D638 Anemia in other chronic diseases classified elsewhere: Secondary | ICD-10-CM | POA: Insufficient documentation

## 2013-04-08 DIAGNOSIS — N049 Nephrotic syndrome with unspecified morphologic changes: Secondary | ICD-10-CM | POA: Insufficient documentation

## 2013-04-08 DIAGNOSIS — N184 Chronic kidney disease, stage 4 (severe): Secondary | ICD-10-CM | POA: Insufficient documentation

## 2013-04-08 LAB — RENAL FUNCTION PANEL
BUN: 56 mg/dL — ABNORMAL HIGH (ref 6–23)
CO2: 21 mEq/L (ref 19–32)
Chloride: 106 mEq/L (ref 96–112)
Glucose, Bld: 83 mg/dL (ref 70–99)
Potassium: 3.7 mEq/L (ref 3.5–5.1)

## 2013-04-08 LAB — IRON AND TIBC
Iron: 84 ug/dL (ref 42–135)
Saturation Ratios: 36 % (ref 20–55)
TIBC: 233 ug/dL — ABNORMAL LOW (ref 250–470)
UIBC: 149 ug/dL (ref 125–400)

## 2013-04-08 LAB — FERRITIN: Ferritin: 128 ng/mL (ref 10–291)

## 2013-04-08 MED ORDER — DARBEPOETIN ALFA-POLYSORBATE 40 MCG/0.4ML IJ SOLN
40.0000 ug | INTRAMUSCULAR | Status: DC
  Administered 2013-04-08: 40 ug via INTRAVENOUS

## 2013-04-08 MED ORDER — DARBEPOETIN ALFA-POLYSORBATE 40 MCG/0.4ML IJ SOLN
INTRAMUSCULAR | Status: AC
  Filled 2013-04-08: qty 0.4

## 2013-05-05 ENCOUNTER — Other Ambulatory Visit (HOSPITAL_COMMUNITY): Payer: Self-pay | Admitting: *Deleted

## 2013-05-06 ENCOUNTER — Encounter (HOSPITAL_COMMUNITY)
Admission: RE | Admit: 2013-05-06 | Discharge: 2013-05-06 | Disposition: A | Discharge status: Home / Self Care | Payer: Medicare Other | Source: Ambulatory Visit | Attending: Nephrology | Admitting: Nephrology

## 2013-05-06 DIAGNOSIS — D638 Anemia in other chronic diseases classified elsewhere: Secondary | ICD-10-CM | POA: Insufficient documentation

## 2013-05-06 DIAGNOSIS — N184 Chronic kidney disease, stage 4 (severe): Secondary | ICD-10-CM | POA: Insufficient documentation

## 2013-05-06 DIAGNOSIS — N049 Nephrotic syndrome with unspecified morphologic changes: Secondary | ICD-10-CM | POA: Insufficient documentation

## 2013-05-06 LAB — FERRITIN: Ferritin: 95 ng/mL (ref 10–291)

## 2013-05-06 LAB — RENAL FUNCTION PANEL
BUN: 71 mg/dL — ABNORMAL HIGH (ref 6–23)
CO2: 20 mEq/L (ref 19–32)
Calcium: 8.5 mg/dL (ref 8.4–10.5)
Glucose, Bld: 79 mg/dL (ref 70–99)
Phosphorus: 4.6 mg/dL (ref 2.3–4.6)

## 2013-05-06 LAB — IRON AND TIBC
Iron: 66 ug/dL (ref 42–135)
Saturation Ratios: 26 % (ref 20–55)
UIBC: 185 ug/dL (ref 125–400)

## 2013-05-06 MED ORDER — DARBEPOETIN ALFA-POLYSORBATE 40 MCG/0.4ML IJ SOLN: 40.0000 ug | INTRAMUSCULAR | Status: DC

## 2013-05-06 MED ORDER — DARBEPOETIN ALFA-POLYSORBATE 40 MCG/0.4ML IJ SOLN
INTRAMUSCULAR | Status: AC
  Administered 2013-05-06: 40 ug
  Filled 2013-05-06: qty 0.4

## 2013-06-03 ENCOUNTER — Encounter (HOSPITAL_COMMUNITY)
Admission: RE | Admit: 2013-06-03 | Discharge: 2013-06-03 | Disposition: A | Discharge status: Home / Self Care | Payer: Medicare Other | Source: Ambulatory Visit | Attending: Nephrology | Admitting: Nephrology

## 2013-06-03 DIAGNOSIS — N184 Chronic kidney disease, stage 4 (severe): Secondary | ICD-10-CM | POA: Insufficient documentation

## 2013-06-03 DIAGNOSIS — N049 Nephrotic syndrome with unspecified morphologic changes: Secondary | ICD-10-CM | POA: Insufficient documentation

## 2013-06-03 DIAGNOSIS — D638 Anemia in other chronic diseases classified elsewhere: Secondary | ICD-10-CM | POA: Insufficient documentation

## 2013-06-03 LAB — FERRITIN: Ferritin: 102 ng/mL (ref 10–291)

## 2013-06-03 LAB — RENAL FUNCTION PANEL
Albumin: 2.8 g/dL — ABNORMAL LOW (ref 3.5–5.2)
BUN: 64 mg/dL — ABNORMAL HIGH (ref 6–23)
Calcium: 8.6 mg/dL (ref 8.4–10.5)
Glucose, Bld: 92 mg/dL (ref 70–99)
Phosphorus: 5.1 mg/dL — ABNORMAL HIGH (ref 2.3–4.6)
Potassium: 3.9 mEq/L (ref 3.5–5.1)
Sodium: 140 mEq/L (ref 135–145)

## 2013-06-03 LAB — IRON AND TIBC
Iron: 94 ug/dL (ref 42–135)
UIBC: 148 ug/dL (ref 125–400)

## 2013-06-03 MED ORDER — DARBEPOETIN ALFA-POLYSORBATE 40 MCG/0.4ML IJ SOLN
40.0000 ug | INTRAMUSCULAR | Status: DC
  Administered 2013-06-03: 40 ug via INTRAVENOUS

## 2013-06-03 MED ORDER — DARBEPOETIN ALFA-POLYSORBATE 40 MCG/0.4ML IJ SOLN
INTRAMUSCULAR | Status: AC
  Filled 2013-06-03: qty 0.4

## 2013-07-01 ENCOUNTER — Encounter (HOSPITAL_COMMUNITY)
Admission: RE | Admit: 2013-07-01 | Discharge: 2013-07-01 | Disposition: A | Discharge status: Home / Self Care | Payer: Medicare Other | Source: Ambulatory Visit | Attending: Nephrology | Admitting: Nephrology

## 2013-07-01 DIAGNOSIS — N049 Nephrotic syndrome with unspecified morphologic changes: Secondary | ICD-10-CM | POA: Insufficient documentation

## 2013-07-01 DIAGNOSIS — D638 Anemia in other chronic diseases classified elsewhere: Secondary | ICD-10-CM | POA: Insufficient documentation

## 2013-07-01 DIAGNOSIS — N184 Chronic kidney disease, stage 4 (severe): Secondary | ICD-10-CM | POA: Insufficient documentation

## 2013-07-01 LAB — RENAL FUNCTION PANEL
Albumin: 2.9 g/dL — ABNORMAL LOW (ref 3.5–5.2)
BUN: 66 mg/dL — ABNORMAL HIGH (ref 6–23)
Chloride: 104 mEq/L (ref 96–112)
GFR calc Af Amer: 5 mL/min — ABNORMAL LOW (ref >90)
GFR calc non Af Amer: 4 mL/min — ABNORMAL LOW (ref >90)
Phosphorus: 5.2 mg/dL — ABNORMAL HIGH (ref 2.3–4.6)
Potassium: 4.3 mEq/L (ref 3.5–5.1)
Sodium: 136 mEq/L (ref 135–145)

## 2013-07-01 LAB — IRON AND TIBC
Iron: 86 ug/dL (ref 42–135)
TIBC: 244 ug/dL — ABNORMAL LOW (ref 250–470)

## 2013-07-01 MED ORDER — DARBEPOETIN ALFA-POLYSORBATE 40 MCG/0.4ML IJ SOLN
40.0000 ug | INTRAMUSCULAR | Status: DC
  Administered 2013-07-01: 40 ug via INTRAVENOUS

## 2013-07-01 MED ORDER — DARBEPOETIN ALFA-POLYSORBATE 40 MCG/0.4ML IJ SOLN
INTRAMUSCULAR | Status: AC
  Filled 2013-07-01: qty 0.4

## 2013-07-29 ENCOUNTER — Encounter (HOSPITAL_COMMUNITY)
Admission: RE | Admit: 2013-07-29 | Discharge: 2013-07-29 | Disposition: A | Discharge status: Home / Self Care | Payer: Medicare Other | Source: Ambulatory Visit | Attending: Nephrology | Admitting: Nephrology

## 2013-07-29 DIAGNOSIS — D638 Anemia in other chronic diseases classified elsewhere: Secondary | ICD-10-CM | POA: Insufficient documentation

## 2013-07-29 DIAGNOSIS — N184 Chronic kidney disease, stage 4 (severe): Secondary | ICD-10-CM | POA: Insufficient documentation

## 2013-07-29 DIAGNOSIS — N049 Nephrotic syndrome with unspecified morphologic changes: Secondary | ICD-10-CM | POA: Insufficient documentation

## 2013-07-29 LAB — IRON AND TIBC
Iron: 73 ug/dL (ref 42–135)
Saturation Ratios: 29 % (ref 20–55)
UIBC: 182 ug/dL (ref 125–400)

## 2013-07-29 LAB — RENAL FUNCTION PANEL
CO2: 19 mEq/L (ref 19–32)
Chloride: 105 mEq/L (ref 96–112)
GFR calc Af Amer: 5 mL/min — ABNORMAL LOW (ref >90)
Glucose, Bld: 80 mg/dL (ref 70–99)
Potassium: 4.2 mEq/L (ref 3.5–5.1)
Sodium: 138 mEq/L (ref 135–145)

## 2013-07-29 MED ORDER — EPOETIN ALFA 40000 UNIT/ML IJ SOLN
INTRAMUSCULAR | Status: AC
  Filled 2013-07-29: qty 1

## 2013-07-29 MED ORDER — DARBEPOETIN ALFA-POLYSORBATE 40 MCG/0.4ML IJ SOLN
40.0000 ug | INTRAMUSCULAR | Status: DC
  Administered 2013-07-29: 40 ug via INTRAVENOUS

## 2013-07-29 MED ORDER — DARBEPOETIN ALFA-POLYSORBATE 40 MCG/0.4ML IJ SOLN
INTRAMUSCULAR | Status: AC
  Administered 2013-07-29: 40 ug via INTRAVENOUS
  Filled 2013-07-29: qty 0.4

## 2013-08-25 ENCOUNTER — Other Ambulatory Visit (HOSPITAL_COMMUNITY): Payer: Self-pay

## 2013-08-26 ENCOUNTER — Encounter (HOSPITAL_COMMUNITY)
Admission: RE | Admit: 2013-08-26 | Discharge: 2013-08-26 | Disposition: A | Discharge status: Home / Self Care | Payer: Medicare Other | Source: Ambulatory Visit | Attending: Nephrology | Admitting: Nephrology

## 2013-08-26 DIAGNOSIS — N184 Chronic kidney disease, stage 4 (severe): Secondary | ICD-10-CM | POA: Insufficient documentation

## 2013-08-26 DIAGNOSIS — N049 Nephrotic syndrome with unspecified morphologic changes: Secondary | ICD-10-CM | POA: Insufficient documentation

## 2013-08-26 DIAGNOSIS — D638 Anemia in other chronic diseases classified elsewhere: Secondary | ICD-10-CM | POA: Insufficient documentation

## 2013-08-26 LAB — RENAL FUNCTION PANEL
BUN: 64 mg/dL — ABNORMAL HIGH (ref 6–23)
Calcium: 7.8 mg/dL — ABNORMAL LOW (ref 8.4–10.5)
Glucose, Bld: 78 mg/dL (ref 70–99)
Phosphorus: 5.6 mg/dL — ABNORMAL HIGH (ref 2.3–4.6)
Potassium: 3.8 mEq/L (ref 3.5–5.1)

## 2013-08-26 LAB — IRON AND TIBC
Iron: 65 ug/dL (ref 42–135)
UIBC: 171 ug/dL (ref 125–400)

## 2013-08-26 LAB — FERRITIN: Ferritin: 101 ng/mL (ref 10–291)

## 2013-08-26 MED ORDER — DARBEPOETIN ALFA-POLYSORBATE 40 MCG/0.4ML IJ SOLN
INTRAMUSCULAR | Status: AC
  Administered 2013-08-26: 40 ug via INTRAVENOUS
  Filled 2013-08-26: qty 0.4

## 2013-08-26 MED ORDER — DARBEPOETIN ALFA-POLYSORBATE 40 MCG/0.4ML IJ SOLN
40.0000 ug | INTRAMUSCULAR | Status: DC
  Administered 2013-08-26: 40 ug via INTRAVENOUS

## 2013-09-23 ENCOUNTER — Encounter (HOSPITAL_COMMUNITY)
Admission: RE | Admit: 2013-09-23 | Discharge: 2013-09-23 | Disposition: A | Discharge status: Home / Self Care | Payer: Medicare Other | Source: Ambulatory Visit | Attending: Nephrology | Admitting: Nephrology

## 2013-09-23 DIAGNOSIS — N184 Chronic kidney disease, stage 4 (severe): Secondary | ICD-10-CM | POA: Insufficient documentation

## 2013-09-23 DIAGNOSIS — N049 Nephrotic syndrome with unspecified morphologic changes: Secondary | ICD-10-CM | POA: Insufficient documentation

## 2013-09-23 DIAGNOSIS — D638 Anemia in other chronic diseases classified elsewhere: Secondary | ICD-10-CM | POA: Insufficient documentation

## 2013-09-23 LAB — RENAL FUNCTION PANEL
Albumin: 3 g/dL — ABNORMAL LOW (ref 3.5–5.2)
BUN: 51 mg/dL — ABNORMAL HIGH (ref 6–23)
CO2: 19 mEq/L (ref 19–32)
Calcium: 8.2 mg/dL — ABNORMAL LOW (ref 8.4–10.5)
Chloride: 108 mEq/L (ref 96–112)
Creatinine, Ser: 8.05 mg/dL — ABNORMAL HIGH (ref 0.50–1.10)
GFR calc Af Amer: 5 mL/min — ABNORMAL LOW (ref >90)
GFR calc non Af Amer: 4 mL/min — ABNORMAL LOW (ref >90)
Sodium: 140 mEq/L (ref 135–145)

## 2013-09-23 LAB — IRON AND TIBC
Saturation Ratios: 31 % (ref 20–55)
TIBC: 248 ug/dL — ABNORMAL LOW (ref 250–470)

## 2013-09-23 LAB — POCT HEMOGLOBIN-HEMACUE: Hemoglobin: 10.4 g/dL — ABNORMAL LOW (ref 12.0–15.0)

## 2013-09-23 MED ORDER — DARBEPOETIN ALFA-POLYSORBATE 40 MCG/0.4ML IJ SOLN
INTRAMUSCULAR | Status: AC
  Filled 2013-09-23: qty 0.4

## 2013-09-23 MED ORDER — DARBEPOETIN ALFA-POLYSORBATE 40 MCG/0.4ML IJ SOLN
40.0000 ug | INTRAMUSCULAR | Status: DC
  Administered 2013-09-23: 40 ug via INTRAVENOUS

## 2013-10-21 ENCOUNTER — Encounter (HOSPITAL_COMMUNITY)
Admission: RE | Admit: 2013-10-21 | Discharge: 2013-10-21 | Disposition: A | Discharge status: Home / Self Care | Payer: Medicare Other | Source: Ambulatory Visit | Attending: Nephrology | Admitting: Nephrology

## 2013-10-21 DIAGNOSIS — D638 Anemia in other chronic diseases classified elsewhere: Secondary | ICD-10-CM | POA: Insufficient documentation

## 2013-10-21 DIAGNOSIS — N184 Chronic kidney disease, stage 4 (severe): Secondary | ICD-10-CM | POA: Insufficient documentation

## 2013-10-21 DIAGNOSIS — N049 Nephrotic syndrome with unspecified morphologic changes: Secondary | ICD-10-CM | POA: Insufficient documentation

## 2013-10-21 LAB — RENAL FUNCTION PANEL
ALBUMIN: 2.9 g/dL — AB (ref 3.5–5.2)
BUN: 63 mg/dL — AB (ref 6–23)
CALCIUM: 7.9 mg/dL — AB (ref 8.4–10.5)
CO2: 19 mEq/L (ref 19–32)
CREATININE: 8.54 mg/dL — AB (ref 0.50–1.10)
Chloride: 106 mEq/L (ref 96–112)
GFR calc Af Amer: 5 mL/min — ABNORMAL LOW (ref >90)
GFR calc non Af Amer: 4 mL/min — ABNORMAL LOW (ref >90)
Glucose, Bld: 90 mg/dL (ref 70–99)
PHOSPHORUS: 6.3 mg/dL — AB (ref 2.3–4.6)
POTASSIUM: 4.6 meq/L (ref 3.7–5.3)
Sodium: 141 mEq/L (ref 137–147)

## 2013-10-21 LAB — IRON AND TIBC
Iron: 80 ug/dL (ref 42–135)
Saturation Ratios: 31 % (ref 20–55)
TIBC: 257 ug/dL (ref 250–470)
UIBC: 177 ug/dL (ref 125–400)

## 2013-10-21 LAB — FERRITIN: FERRITIN: 89 ng/mL (ref 10–291)

## 2013-10-21 MED ORDER — DARBEPOETIN ALFA-POLYSORBATE 40 MCG/0.4ML IJ SOLN
INTRAMUSCULAR | Status: AC
  Administered 2013-10-21: 14:00:00 40 ug via INTRAVENOUS
  Filled 2013-10-21: qty 0.4

## 2013-10-21 MED ORDER — DARBEPOETIN ALFA-POLYSORBATE 40 MCG/0.4ML IJ SOLN
40.0000 ug | INTRAMUSCULAR | Status: DC
  Administered 2013-10-21: 40 ug via INTRAVENOUS

## 2013-11-11 ENCOUNTER — Encounter: Payer: Self-pay | Admitting: Gastroenterology

## 2013-11-17 ENCOUNTER — Other Ambulatory Visit (HOSPITAL_COMMUNITY): Payer: Self-pay | Admitting: *Deleted

## 2013-11-18 ENCOUNTER — Encounter (HOSPITAL_COMMUNITY)
Admission: RE | Admit: 2013-11-18 | Discharge: 2013-11-18 | Disposition: A | Discharge status: Home / Self Care | Payer: Medicare Other | Source: Ambulatory Visit | Attending: Nephrology | Admitting: Nephrology

## 2013-11-18 DIAGNOSIS — D638 Anemia in other chronic diseases classified elsewhere: Secondary | ICD-10-CM | POA: Insufficient documentation

## 2013-11-18 DIAGNOSIS — N184 Chronic kidney disease, stage 4 (severe): Secondary | ICD-10-CM | POA: Insufficient documentation

## 2013-11-18 DIAGNOSIS — N049 Nephrotic syndrome with unspecified morphologic changes: Secondary | ICD-10-CM | POA: Insufficient documentation

## 2013-11-18 LAB — RENAL FUNCTION PANEL
Albumin: 3 g/dL — ABNORMAL LOW (ref 3.5–5.2)
BUN: 63 mg/dL — ABNORMAL HIGH (ref 6–23)
CHLORIDE: 108 meq/L (ref 96–112)
CO2: 18 meq/L — AB (ref 19–32)
Calcium: 8.1 mg/dL — ABNORMAL LOW (ref 8.4–10.5)
Creatinine, Ser: 8.97 mg/dL — ABNORMAL HIGH (ref 0.50–1.10)
GFR calc Af Amer: 4 mL/min — ABNORMAL LOW (ref >90)
GFR, EST NON AFRICAN AMERICAN: 4 mL/min — AB (ref >90)
Glucose, Bld: 82 mg/dL (ref 70–99)
POTASSIUM: 4.5 meq/L (ref 3.7–5.3)
Phosphorus: 5.5 mg/dL — ABNORMAL HIGH (ref 2.3–4.6)
SODIUM: 142 meq/L (ref 137–147)

## 2013-11-18 LAB — IRON AND TIBC
IRON: 63 ug/dL (ref 42–135)
SATURATION RATIOS: 24 % (ref 20–55)
TIBC: 262 ug/dL (ref 250–470)
UIBC: 199 ug/dL (ref 125–400)

## 2013-11-18 LAB — FERRITIN: Ferritin: 91 ng/mL (ref 10–291)

## 2013-11-18 LAB — POCT HEMOGLOBIN-HEMACUE: Hemoglobin: 9.7 g/dL — ABNORMAL LOW (ref 12.0–15.0)

## 2013-11-18 MED ORDER — DARBEPOETIN ALFA-POLYSORBATE 40 MCG/0.4ML IJ SOLN
INTRAMUSCULAR | Status: AC
  Filled 2013-11-18: qty 0.4

## 2013-11-18 MED ORDER — DARBEPOETIN ALFA-POLYSORBATE 40 MCG/0.4ML IJ SOLN
40.0000 ug | INTRAMUSCULAR | Status: DC
  Administered 2013-11-18: 40 ug via INTRAVENOUS

## 2013-12-16 ENCOUNTER — Encounter (HOSPITAL_COMMUNITY)
Admission: RE | Admit: 2013-12-16 | Discharge: 2013-12-16 | Disposition: A | Discharge status: Home / Self Care | Payer: Medicare Other | Source: Ambulatory Visit | Attending: Nephrology | Admitting: Nephrology

## 2013-12-16 DIAGNOSIS — N184 Chronic kidney disease, stage 4 (severe): Secondary | ICD-10-CM | POA: Insufficient documentation

## 2013-12-16 DIAGNOSIS — N049 Nephrotic syndrome with unspecified morphologic changes: Secondary | ICD-10-CM | POA: Insufficient documentation

## 2013-12-16 DIAGNOSIS — D638 Anemia in other chronic diseases classified elsewhere: Secondary | ICD-10-CM | POA: Insufficient documentation

## 2013-12-16 LAB — IRON AND TIBC
Iron: 69 ug/dL (ref 42–135)
SATURATION RATIOS: 28 % (ref 20–55)
TIBC: 246 ug/dL — ABNORMAL LOW (ref 250–470)
UIBC: 177 ug/dL (ref 125–400)

## 2013-12-16 LAB — RENAL FUNCTION PANEL
ALBUMIN: 2.9 g/dL — AB (ref 3.5–5.2)
BUN: 50 mg/dL — ABNORMAL HIGH (ref 6–23)
CALCIUM: 8.4 mg/dL (ref 8.4–10.5)
CO2: 19 meq/L (ref 19–32)
Chloride: 109 mEq/L (ref 96–112)
Creatinine, Ser: 8.61 mg/dL — ABNORMAL HIGH (ref 0.50–1.10)
GFR, EST AFRICAN AMERICAN: 4 mL/min — AB (ref >90)
GFR, EST NON AFRICAN AMERICAN: 4 mL/min — AB (ref >90)
Glucose, Bld: 101 mg/dL — ABNORMAL HIGH (ref 70–99)
Phosphorus: 4.9 mg/dL — ABNORMAL HIGH (ref 2.3–4.6)
Potassium: 4.3 mEq/L (ref 3.7–5.3)
SODIUM: 145 meq/L (ref 137–147)

## 2013-12-16 LAB — FERRITIN: Ferritin: 110 ng/mL (ref 10–291)

## 2013-12-16 MED ORDER — DARBEPOETIN ALFA-POLYSORBATE 40 MCG/0.4ML IJ SOLN
40.0000 ug | INTRAMUSCULAR | Status: DC
  Administered 2013-12-16: 40 ug via INTRAVENOUS

## 2013-12-16 MED ORDER — DARBEPOETIN ALFA-POLYSORBATE 40 MCG/0.4ML IJ SOLN
INTRAMUSCULAR | Status: AC
  Filled 2013-12-16: qty 0.4

## 2013-12-19 LAB — POCT HEMOGLOBIN-HEMACUE: Hemoglobin: 9.8 g/dL — ABNORMAL LOW (ref 12.0–15.0)

## 2014-01-13 ENCOUNTER — Encounter (HOSPITAL_COMMUNITY)
Admission: RE | Admit: 2014-01-13 | Discharge: 2014-01-13 | Disposition: A | Discharge status: Home / Self Care | Payer: Medicare Other | Source: Ambulatory Visit | Attending: Nephrology | Admitting: Nephrology

## 2014-01-13 DIAGNOSIS — N049 Nephrotic syndrome with unspecified morphologic changes: Secondary | ICD-10-CM | POA: Insufficient documentation

## 2014-01-13 DIAGNOSIS — N184 Chronic kidney disease, stage 4 (severe): Secondary | ICD-10-CM | POA: Insufficient documentation

## 2014-01-13 DIAGNOSIS — D638 Anemia in other chronic diseases classified elsewhere: Secondary | ICD-10-CM | POA: Insufficient documentation

## 2014-01-13 LAB — IRON AND TIBC
IRON: 77 ug/dL (ref 42–135)
Saturation Ratios: 31 % (ref 20–55)
TIBC: 246 ug/dL — AB (ref 250–470)
UIBC: 169 ug/dL (ref 125–400)

## 2014-01-13 LAB — RENAL FUNCTION PANEL
Albumin: 2.8 g/dL — ABNORMAL LOW (ref 3.5–5.2)
BUN: 69 mg/dL — ABNORMAL HIGH (ref 6–23)
CHLORIDE: 106 meq/L (ref 96–112)
CO2: 17 meq/L — AB (ref 19–32)
Calcium: 8.1 mg/dL — ABNORMAL LOW (ref 8.4–10.5)
Creatinine, Ser: 8.64 mg/dL — ABNORMAL HIGH (ref 0.50–1.10)
GFR calc Af Amer: 4 mL/min — ABNORMAL LOW (ref >90)
GFR calc non Af Amer: 4 mL/min — ABNORMAL LOW (ref >90)
GLUCOSE: 131 mg/dL — AB (ref 70–99)
Phosphorus: 5.5 mg/dL — ABNORMAL HIGH (ref 2.3–4.6)
Potassium: 4.1 mEq/L (ref 3.7–5.3)
Sodium: 140 mEq/L (ref 137–147)

## 2014-01-13 LAB — POCT HEMOGLOBIN-HEMACUE: Hemoglobin: 9.5 g/dL — ABNORMAL LOW (ref 12.0–15.0)

## 2014-01-13 LAB — FERRITIN: Ferritin: 104 ng/mL (ref 10–291)

## 2014-01-13 MED ORDER — DARBEPOETIN ALFA-POLYSORBATE 40 MCG/0.4ML IJ SOLN
INTRAMUSCULAR | Status: AC
  Filled 2014-01-13: qty 0.4

## 2014-01-13 MED ORDER — DARBEPOETIN ALFA-POLYSORBATE 40 MCG/0.4ML IJ SOLN
40.0000 ug | INTRAMUSCULAR | Status: DC
  Administered 2014-01-13: 40 ug via INTRAVENOUS

## 2014-02-09 ENCOUNTER — Other Ambulatory Visit (HOSPITAL_COMMUNITY): Payer: Self-pay | Admitting: *Deleted

## 2014-02-10 ENCOUNTER — Encounter (HOSPITAL_COMMUNITY)
Admission: RE | Admit: 2014-02-10 | Discharge: 2014-02-10 | Disposition: A | Discharge status: Home / Self Care | Payer: Medicare Other | Source: Ambulatory Visit | Attending: Nephrology | Admitting: Nephrology

## 2014-02-10 DIAGNOSIS — N184 Chronic kidney disease, stage 4 (severe): Secondary | ICD-10-CM | POA: Insufficient documentation

## 2014-02-10 DIAGNOSIS — N049 Nephrotic syndrome with unspecified morphologic changes: Secondary | ICD-10-CM | POA: Insufficient documentation

## 2014-02-10 DIAGNOSIS — D638 Anemia in other chronic diseases classified elsewhere: Secondary | ICD-10-CM | POA: Insufficient documentation

## 2014-02-10 LAB — FERRITIN: FERRITIN: 113 ng/mL (ref 10–291)

## 2014-02-10 LAB — RENAL FUNCTION PANEL
Albumin: 2.8 g/dL — ABNORMAL LOW (ref 3.5–5.2)
BUN: 69 mg/dL — AB (ref 6–23)
CO2: 20 meq/L (ref 19–32)
Calcium: 8.3 mg/dL — ABNORMAL LOW (ref 8.4–10.5)
Chloride: 105 mEq/L (ref 96–112)
Creatinine, Ser: 9.51 mg/dL — ABNORMAL HIGH (ref 0.50–1.10)
GFR calc Af Amer: 4 mL/min — ABNORMAL LOW (ref >90)
GFR calc non Af Amer: 3 mL/min — ABNORMAL LOW (ref >90)
GLUCOSE: 74 mg/dL (ref 70–99)
PHOSPHORUS: 5.9 mg/dL — AB (ref 2.3–4.6)
Potassium: 4.9 mEq/L (ref 3.7–5.3)
Sodium: 141 mEq/L (ref 137–147)

## 2014-02-10 LAB — IRON AND TIBC
Iron: 75 ug/dL (ref 42–135)
Saturation Ratios: 29 % (ref 20–55)
TIBC: 259 ug/dL (ref 250–470)
UIBC: 184 ug/dL (ref 125–400)

## 2014-02-10 LAB — POCT HEMOGLOBIN-HEMACUE: Hemoglobin: 8.9 g/dL — ABNORMAL LOW (ref 12.0–15.0)

## 2014-02-10 MED ORDER — DARBEPOETIN ALFA-POLYSORBATE 40 MCG/0.4ML IJ SOLN: 40.0000 ug | INTRAMUSCULAR | Status: DC

## 2014-02-10 MED ORDER — DARBEPOETIN ALFA-POLYSORBATE 40 MCG/0.4ML IJ SOLN
INTRAMUSCULAR | Status: AC
  Administered 2014-02-10: 40 ug via SUBCUTANEOUS
  Filled 2014-02-10: qty 0.4

## 2014-03-10 ENCOUNTER — Encounter (HOSPITAL_COMMUNITY)
Admission: RE | Admit: 2014-03-10 | Discharge: 2014-03-10 | Disposition: A | Discharge status: Home / Self Care | Payer: Medicare Other | Source: Ambulatory Visit | Attending: Nephrology | Admitting: Nephrology

## 2014-03-10 LAB — RENAL FUNCTION PANEL
ALBUMIN: 2.8 g/dL — AB (ref 3.5–5.2)
BUN: 69 mg/dL — ABNORMAL HIGH (ref 6–23)
CALCIUM: 7.9 mg/dL — AB (ref 8.4–10.5)
CHLORIDE: 106 meq/L (ref 96–112)
CO2: 18 mEq/L — ABNORMAL LOW (ref 19–32)
CREATININE: 9.71 mg/dL — AB (ref 0.50–1.10)
GFR, EST AFRICAN AMERICAN: 4 mL/min — AB (ref >90)
GFR, EST NON AFRICAN AMERICAN: 3 mL/min — AB (ref >90)
Glucose, Bld: 111 mg/dL — ABNORMAL HIGH (ref 70–99)
Phosphorus: 6.2 mg/dL — ABNORMAL HIGH (ref 2.3–4.6)
Potassium: 4.1 mEq/L (ref 3.7–5.3)
SODIUM: 142 meq/L (ref 137–147)

## 2014-03-10 LAB — POCT HEMOGLOBIN-HEMACUE: HEMOGLOBIN: 8.5 g/dL — AB (ref 12.0–15.0)

## 2014-03-10 LAB — IRON AND TIBC
Iron: 66 ug/dL (ref 42–135)
Saturation Ratios: 27 % (ref 20–55)
TIBC: 245 ug/dL — ABNORMAL LOW (ref 250–470)
UIBC: 179 ug/dL (ref 125–400)

## 2014-03-10 LAB — FERRITIN: Ferritin: 92 ng/mL (ref 10–291)

## 2014-03-10 MED ORDER — DARBEPOETIN ALFA-POLYSORBATE 40 MCG/0.4ML IJ SOLN
INTRAMUSCULAR | Status: AC
  Administered 2014-03-10: 40 ug via SUBCUTANEOUS
  Filled 2014-03-10: qty 0.4

## 2014-03-10 MED ORDER — DARBEPOETIN ALFA-POLYSORBATE 40 MCG/0.4ML IJ SOLN: 40.0000 ug | INTRAMUSCULAR | Status: DC

## 2014-04-07 ENCOUNTER — Encounter (HOSPITAL_COMMUNITY)
Admission: RE | Admit: 2014-04-07 | Discharge: 2014-04-07 | Disposition: A | Discharge status: Home / Self Care | Payer: Medicare Other | Source: Ambulatory Visit | Attending: Nephrology | Admitting: Nephrology

## 2014-04-07 DIAGNOSIS — N184 Chronic kidney disease, stage 4 (severe): Secondary | ICD-10-CM | POA: Insufficient documentation

## 2014-04-07 DIAGNOSIS — N049 Nephrotic syndrome with unspecified morphologic changes: Secondary | ICD-10-CM | POA: Insufficient documentation

## 2014-04-07 DIAGNOSIS — D638 Anemia in other chronic diseases classified elsewhere: Secondary | ICD-10-CM | POA: Insufficient documentation

## 2014-04-07 LAB — RENAL FUNCTION PANEL
Albumin: 2.8 g/dL — ABNORMAL LOW (ref 3.5–5.2)
BUN: 76 mg/dL — ABNORMAL HIGH (ref 6–23)
CO2: 17 mEq/L — ABNORMAL LOW (ref 19–32)
Calcium: 8.2 mg/dL — ABNORMAL LOW (ref 8.4–10.5)
Chloride: 103 mEq/L (ref 96–112)
Creatinine, Ser: 10.12 mg/dL — ABNORMAL HIGH (ref 0.50–1.10)
GFR, EST AFRICAN AMERICAN: 4 mL/min — AB (ref >90)
GFR, EST NON AFRICAN AMERICAN: 3 mL/min — AB (ref >90)
Glucose, Bld: 124 mg/dL — ABNORMAL HIGH (ref 70–99)
Phosphorus: 5.8 mg/dL — ABNORMAL HIGH (ref 2.3–4.6)
Potassium: 3.9 mEq/L (ref 3.7–5.3)
SODIUM: 140 meq/L (ref 137–147)

## 2014-04-07 LAB — IRON AND TIBC
IRON: 68 ug/dL (ref 42–135)
Saturation Ratios: 30 % (ref 20–55)
TIBC: 226 ug/dL — ABNORMAL LOW (ref 250–470)
UIBC: 158 ug/dL (ref 125–400)

## 2014-04-07 LAB — FERRITIN: FERRITIN: 85 ng/mL (ref 10–291)

## 2014-04-07 MED ORDER — DARBEPOETIN ALFA-POLYSORBATE 40 MCG/0.4ML IJ SOLN: 40.0000 ug | INTRAMUSCULAR | Status: DC

## 2014-04-07 MED ORDER — DARBEPOETIN ALFA-POLYSORBATE 40 MCG/0.4ML IJ SOLN
INTRAMUSCULAR | Status: AC
  Administered 2014-04-07: 40 ug via SUBCUTANEOUS
  Filled 2014-04-07: qty 0.4

## 2014-04-07 MED ORDER — EPOETIN ALFA 40000 UNIT/ML IJ SOLN
INTRAMUSCULAR | Status: AC
  Filled 2014-04-07: qty 1

## 2014-04-10 LAB — POCT HEMOGLOBIN-HEMACUE: HEMOGLOBIN: 8.4 g/dL — AB (ref 12.0–15.0)

## 2014-04-17 ENCOUNTER — Encounter (HOSPITAL_COMMUNITY): Payer: Medicare Other

## 2014-05-05 ENCOUNTER — Encounter (HOSPITAL_COMMUNITY)
Admission: RE | Admit: 2014-05-05 | Discharge: 2014-05-05 | Disposition: A | Discharge status: Home / Self Care | Payer: Medicare Other | Source: Ambulatory Visit | Attending: Nephrology | Admitting: Nephrology

## 2014-05-05 DIAGNOSIS — N184 Chronic kidney disease, stage 4 (severe): Secondary | ICD-10-CM | POA: Insufficient documentation

## 2014-05-05 DIAGNOSIS — D638 Anemia in other chronic diseases classified elsewhere: Secondary | ICD-10-CM | POA: Insufficient documentation

## 2014-05-05 DIAGNOSIS — N049 Nephrotic syndrome with unspecified morphologic changes: Secondary | ICD-10-CM | POA: Insufficient documentation

## 2014-05-05 LAB — IRON AND TIBC
IRON: 31 ug/dL — AB (ref 42–135)
Saturation Ratios: 12 % — ABNORMAL LOW (ref 20–55)
TIBC: 253 ug/dL (ref 250–470)
UIBC: 222 ug/dL (ref 125–400)

## 2014-05-05 LAB — RENAL FUNCTION PANEL
ALBUMIN: 3 g/dL — AB (ref 3.5–5.2)
Anion gap: 21 — ABNORMAL HIGH (ref 5–15)
BUN: 79 mg/dL — ABNORMAL HIGH (ref 6–23)
CO2: 18 mEq/L — ABNORMAL LOW (ref 19–32)
Calcium: 7.2 mg/dL — ABNORMAL LOW (ref 8.4–10.5)
Chloride: 105 mEq/L (ref 96–112)
Creatinine, Ser: 10.96 mg/dL — ABNORMAL HIGH (ref 0.50–1.10)
GFR calc Af Amer: 3 mL/min — ABNORMAL LOW (ref >90)
GFR, EST NON AFRICAN AMERICAN: 3 mL/min — AB (ref >90)
Glucose, Bld: 121 mg/dL — ABNORMAL HIGH (ref 70–99)
PHOSPHORUS: 6.2 mg/dL — AB (ref 2.3–4.6)
POTASSIUM: 4.1 meq/L (ref 3.7–5.3)
Sodium: 144 mEq/L (ref 137–147)

## 2014-05-05 LAB — FERRITIN: Ferritin: 68 ng/mL (ref 10–291)

## 2014-05-05 MED ORDER — SODIUM CHLORIDE 0.9 % IV SOLN
510.0000 mg | INTRAVENOUS | Status: DC
  Administered 2014-05-05: 510 mg via INTRAVENOUS
  Filled 2014-05-05: qty 17

## 2014-05-05 MED ORDER — DARBEPOETIN ALFA-POLYSORBATE 40 MCG/0.4ML IJ SOLN
40.0000 ug | INTRAMUSCULAR | Status: DC
  Administered 2014-05-05: 40 ug via INTRAVENOUS

## 2014-05-05 MED ORDER — DARBEPOETIN ALFA-POLYSORBATE 40 MCG/0.4ML IJ SOLN
INTRAMUSCULAR | Status: AC
  Filled 2014-05-05: qty 0.4

## 2014-05-05 NOTE — Progress Notes (Signed)
Patient complains of weakness and feeling trembly.  She states she had nosebleed on Tuesday 05/02/2014.  Hemoque 6.6 .  Both symptoms and hemoque phoned to [facility], Dr Jason Nest CMA. Orders recieved

## 2014-05-08 LAB — POCT HEMOGLOBIN-HEMACUE: HEMOGLOBIN: 6.6 g/dL — AB (ref 12.0–15.0)

## 2014-05-12 ENCOUNTER — Encounter (HOSPITAL_COMMUNITY)
Admission: RE | Admit: 2014-05-12 | Discharge: 2014-05-12 | Disposition: A | Discharge status: Home / Self Care | Payer: Medicare Other | Source: Ambulatory Visit | Attending: Nephrology | Admitting: Nephrology

## 2014-05-12 DIAGNOSIS — D638 Anemia in other chronic diseases classified elsewhere: Secondary | ICD-10-CM | POA: Diagnosis not present

## 2014-05-12 LAB — POCT HEMOGLOBIN-HEMACUE: HEMOGLOBIN: 7 g/dL — AB (ref 12.0–15.0)

## 2014-05-12 MED ORDER — DARBEPOETIN ALFA-POLYSORBATE 40 MCG/0.4ML IJ SOLN
40.0000 ug | INTRAMUSCULAR | Status: DC
  Administered 2014-05-12: 40 ug via INTRAVENOUS

## 2014-05-12 MED ORDER — SODIUM CHLORIDE 0.9 % IV SOLN
510.0000 mg | INTRAVENOUS | Status: AC
  Administered 2014-05-12: 510 mg via INTRAVENOUS
  Filled 2014-05-12: qty 17

## 2014-05-12 MED ORDER — DARBEPOETIN ALFA-POLYSORBATE 40 MCG/0.4ML IJ SOLN
INTRAMUSCULAR | Status: AC
  Filled 2014-05-12: qty 0.4

## 2014-05-12 NOTE — Progress Notes (Signed)
No changes to be made according to mary at [facility].

## 2014-05-12 NOTE — Progress Notes (Signed)
hgb today is 7 called and reported to [facility] in the clinic who stated they would call us back if there were any changes.

## 2014-05-19 ENCOUNTER — Encounter (HOSPITAL_COMMUNITY)
Admission: RE | Admit: 2014-05-19 | Discharge: 2014-05-19 | Disposition: A | Discharge status: Home / Self Care | Payer: Medicare Other | Source: Ambulatory Visit | Attending: Nephrology | Admitting: Nephrology

## 2014-05-19 DIAGNOSIS — N184 Chronic kidney disease, stage 4 (severe): Secondary | ICD-10-CM | POA: Insufficient documentation

## 2014-05-19 DIAGNOSIS — D638 Anemia in other chronic diseases classified elsewhere: Secondary | ICD-10-CM | POA: Diagnosis not present

## 2014-05-19 DIAGNOSIS — N049 Nephrotic syndrome with unspecified morphologic changes: Secondary | ICD-10-CM | POA: Diagnosis not present

## 2014-05-19 MED ORDER — DARBEPOETIN ALFA-POLYSORBATE 40 MCG/0.4ML IJ SOLN
INTRAMUSCULAR | Status: AC
  Filled 2014-05-19: qty 0.4

## 2014-05-19 MED ORDER — DARBEPOETIN ALFA-POLYSORBATE 40 MCG/0.4ML IJ SOLN
40.0000 ug | INTRAMUSCULAR | Status: DC
  Administered 2014-05-19: 40 ug via INTRAVENOUS

## 2014-05-22 LAB — POCT HEMOGLOBIN-HEMACUE: Hemoglobin: 7.3 g/dL — ABNORMAL LOW (ref 12.0–15.0)

## 2014-05-26 ENCOUNTER — Encounter (HOSPITAL_COMMUNITY)
Admission: RE | Admit: 2014-05-26 | Discharge: 2014-05-26 | Disposition: A | Discharge status: Home / Self Care | Payer: Medicare Other | Source: Ambulatory Visit | Attending: Nephrology | Admitting: Nephrology

## 2014-05-26 DIAGNOSIS — D649 Anemia, unspecified: Secondary | ICD-10-CM | POA: Diagnosis present

## 2014-05-26 LAB — POCT HEMOGLOBIN-HEMACUE: Hemoglobin: 7.7 g/dL — ABNORMAL LOW (ref 12.0–15.0)

## 2014-05-26 MED ORDER — DARBEPOETIN ALFA-POLYSORBATE 40 MCG/0.4ML IJ SOLN
INTRAMUSCULAR | Status: AC
  Administered 2014-05-26: 40 ug via INTRAVENOUS
  Filled 2014-05-26: qty 0.4

## 2014-05-26 MED ORDER — DARBEPOETIN ALFA-POLYSORBATE 40 MCG/0.4ML IJ SOLN
40.0000 ug | INTRAMUSCULAR | Status: DC
  Administered 2014-05-26: 40 ug via INTRAVENOUS

## 2014-06-02 ENCOUNTER — Encounter (HOSPITAL_COMMUNITY)
Admission: RE | Admit: 2014-06-02 | Discharge: 2014-06-02 | Disposition: A | Discharge status: Home / Self Care | Payer: Medicare Other | Source: Ambulatory Visit | Attending: Nephrology | Admitting: Nephrology

## 2014-06-02 DIAGNOSIS — D638 Anemia in other chronic diseases classified elsewhere: Secondary | ICD-10-CM | POA: Diagnosis not present

## 2014-06-02 LAB — RENAL FUNCTION PANEL
Albumin: 2.4 g/dL — ABNORMAL LOW (ref 3.5–5.2)
Anion gap: 18 — ABNORMAL HIGH (ref 5–15)
BUN: 79 mg/dL — AB (ref 6–23)
CALCIUM: 8.2 mg/dL — AB (ref 8.4–10.5)
CO2: 17 mEq/L — ABNORMAL LOW (ref 19–32)
Chloride: 107 mEq/L (ref 96–112)
Creatinine, Ser: 11.15 mg/dL — ABNORMAL HIGH (ref 0.50–1.10)
GFR calc non Af Amer: 3 mL/min — ABNORMAL LOW (ref >90)
GFR, EST AFRICAN AMERICAN: 3 mL/min — AB (ref >90)
GLUCOSE: 118 mg/dL — AB (ref 70–99)
Phosphorus: 6.1 mg/dL — ABNORMAL HIGH (ref 2.3–4.6)
Potassium: 4.2 mEq/L (ref 3.7–5.3)
Sodium: 142 mEq/L (ref 137–147)

## 2014-06-02 LAB — IRON AND TIBC
Iron: 36 ug/dL — ABNORMAL LOW (ref 42–135)
SATURATION RATIOS: 18 % — AB (ref 20–55)
TIBC: 203 ug/dL — AB (ref 250–470)
UIBC: 167 ug/dL (ref 125–400)

## 2014-06-02 LAB — FERRITIN: Ferritin: 430 ng/mL — ABNORMAL HIGH (ref 10–291)

## 2014-06-02 LAB — POCT HEMOGLOBIN-HEMACUE: HEMOGLOBIN: 7.9 g/dL — AB (ref 12.0–15.0)

## 2014-06-02 MED ORDER — DARBEPOETIN ALFA-POLYSORBATE 40 MCG/0.4ML IJ SOLN
40.0000 ug | INTRAMUSCULAR | Status: DC
  Administered 2014-06-02: 40 ug via INTRAVENOUS

## 2014-06-02 MED ORDER — DARBEPOETIN ALFA-POLYSORBATE 40 MCG/0.4ML IJ SOLN
INTRAMUSCULAR | Status: AC
  Filled 2014-06-02: qty 0.4

## 2014-06-02 NOTE — Progress Notes (Signed)
Dr. Justin Mend notified of hgb 7.9. No new orders.

## 2014-06-09 ENCOUNTER — Encounter (HOSPITAL_COMMUNITY)
Admission: RE | Admit: 2014-06-09 | Discharge: 2014-06-09 | Disposition: A | Discharge status: Home / Self Care | Payer: Medicare Other | Source: Ambulatory Visit | Attending: Nephrology | Admitting: Nephrology

## 2014-06-09 DIAGNOSIS — D638 Anemia in other chronic diseases classified elsewhere: Secondary | ICD-10-CM | POA: Diagnosis not present

## 2014-06-09 LAB — POCT HEMOGLOBIN-HEMACUE: Hemoglobin: 7.9 g/dL — ABNORMAL LOW (ref 12.0–15.0)

## 2014-06-09 MED ORDER — DARBEPOETIN ALFA-POLYSORBATE 40 MCG/0.4ML IJ SOLN
40.0000 ug | INTRAMUSCULAR | Status: DC
  Administered 2014-06-09: 40 ug via INTRAVENOUS

## 2014-06-09 MED ORDER — DARBEPOETIN ALFA-POLYSORBATE 40 MCG/0.4ML IJ SOLN
INTRAMUSCULAR | Status: AC
  Filled 2014-06-09: qty 0.4

## 2014-06-16 ENCOUNTER — Encounter (HOSPITAL_COMMUNITY)
Admission: RE | Admit: 2014-06-16 | Discharge: 2014-06-16 | Disposition: A | Discharge status: Home / Self Care | Payer: Medicare Other | Source: Ambulatory Visit | Attending: Nephrology | Admitting: Nephrology

## 2014-06-16 DIAGNOSIS — N184 Chronic kidney disease, stage 4 (severe): Secondary | ICD-10-CM | POA: Diagnosis not present

## 2014-06-16 DIAGNOSIS — N049 Nephrotic syndrome with unspecified morphologic changes: Secondary | ICD-10-CM | POA: Diagnosis not present

## 2014-06-16 DIAGNOSIS — D638 Anemia in other chronic diseases classified elsewhere: Secondary | ICD-10-CM | POA: Diagnosis present

## 2014-06-16 LAB — POCT HEMOGLOBIN-HEMACUE: Hemoglobin: 9.3 g/dL — ABNORMAL LOW (ref 12.0–15.0)

## 2014-06-16 MED ORDER — DARBEPOETIN ALFA-POLYSORBATE 40 MCG/0.4ML IJ SOLN
40.0000 ug | INTRAMUSCULAR | Status: DC
  Administered 2014-06-16: 40 ug via INTRAVENOUS

## 2014-06-16 MED ORDER — DARBEPOETIN ALFA-POLYSORBATE 40 MCG/0.4ML IJ SOLN
INTRAMUSCULAR | Status: AC
  Administered 2014-06-16: 40 ug via INTRAVENOUS
  Filled 2014-06-16: qty 0.4

## 2014-06-23 ENCOUNTER — Encounter (HOSPITAL_COMMUNITY)
Admission: RE | Admit: 2014-06-23 | Discharge: 2014-06-23 | Disposition: A | Discharge status: Home / Self Care | Payer: Medicare Other | Source: Ambulatory Visit | Attending: Nephrology | Admitting: Nephrology

## 2014-06-23 DIAGNOSIS — D638 Anemia in other chronic diseases classified elsewhere: Secondary | ICD-10-CM | POA: Diagnosis not present

## 2014-06-23 LAB — POCT HEMOGLOBIN-HEMACUE: Hemoglobin: 9.4 g/dL — ABNORMAL LOW (ref 12.0–15.0)

## 2014-06-23 MED ORDER — DARBEPOETIN ALFA-POLYSORBATE 40 MCG/0.4ML IJ SOLN
40.0000 ug | INTRAMUSCULAR | Status: DC
  Administered 2014-06-23: 40 ug via INTRAVENOUS

## 2014-06-23 MED ORDER — DARBEPOETIN ALFA-POLYSORBATE 40 MCG/0.4ML IJ SOLN
INTRAMUSCULAR | Status: AC
  Administered 2014-06-23: 40 ug via INTRAVENOUS
  Filled 2014-06-23: qty 0.4

## 2014-06-30 ENCOUNTER — Encounter (HOSPITAL_COMMUNITY)
Admission: RE | Admit: 2014-06-30 | Discharge: 2014-06-30 | Disposition: A | Discharge status: Home / Self Care | Payer: Medicare Other | Source: Ambulatory Visit | Attending: Nephrology | Admitting: Nephrology

## 2014-06-30 DIAGNOSIS — D638 Anemia in other chronic diseases classified elsewhere: Secondary | ICD-10-CM | POA: Diagnosis not present

## 2014-06-30 LAB — RENAL FUNCTION PANEL
Albumin: 2.7 g/dL — ABNORMAL LOW (ref 3.5–5.2)
Anion gap: 18 — ABNORMAL HIGH (ref 5–15)
BUN: 76 mg/dL — ABNORMAL HIGH (ref 6–23)
CO2: 16 meq/L — ABNORMAL LOW (ref 19–32)
Calcium: 7.7 mg/dL — ABNORMAL LOW (ref 8.4–10.5)
Chloride: 107 meq/L (ref 96–112)
Creatinine, Ser: 10.99 mg/dL — ABNORMAL HIGH (ref 0.50–1.10)
GFR calc Af Amer: 3 mL/min — ABNORMAL LOW
GFR calc non Af Amer: 3 mL/min — ABNORMAL LOW
Glucose, Bld: 82 mg/dL (ref 70–99)
Phosphorus: 6.7 mg/dL — ABNORMAL HIGH (ref 2.3–4.6)
Potassium: 4.5 meq/L (ref 3.7–5.3)
Sodium: 141 meq/L (ref 137–147)

## 2014-06-30 LAB — IRON AND TIBC
Iron: 47 ug/dL (ref 42–135)
SATURATION RATIOS: 22 % (ref 20–55)
TIBC: 211 ug/dL — ABNORMAL LOW (ref 250–470)
UIBC: 164 ug/dL (ref 125–400)

## 2014-06-30 LAB — POCT HEMOGLOBIN-HEMACUE: Hemoglobin: 9.9 g/dL — ABNORMAL LOW (ref 12.0–15.0)

## 2014-06-30 MED ORDER — DARBEPOETIN ALFA-POLYSORBATE 40 MCG/0.4ML IJ SOLN
40.0000 ug | INTRAMUSCULAR | Status: DC
  Administered 2014-06-30: 40 ug via INTRAVENOUS

## 2014-06-30 MED ORDER — DARBEPOETIN ALFA-POLYSORBATE 40 MCG/0.4ML IJ SOLN
INTRAMUSCULAR | Status: AC
  Filled 2014-06-30: qty 0.4

## 2014-07-01 LAB — FERRITIN: FERRITIN: 184 ng/mL (ref 10–291)

## 2014-07-07 ENCOUNTER — Encounter (HOSPITAL_COMMUNITY)
Admission: RE | Admit: 2014-07-07 | Discharge: 2014-07-07 | Disposition: A | Discharge status: Home / Self Care | Payer: Medicare Other | Source: Ambulatory Visit | Attending: Nephrology | Admitting: Nephrology

## 2014-07-07 DIAGNOSIS — D638 Anemia in other chronic diseases classified elsewhere: Secondary | ICD-10-CM | POA: Diagnosis not present

## 2014-07-07 LAB — POCT HEMOGLOBIN-HEMACUE: HEMOGLOBIN: 10.4 g/dL — AB (ref 12.0–15.0)

## 2014-07-07 MED ORDER — DARBEPOETIN ALFA-POLYSORBATE 40 MCG/0.4ML IJ SOLN
40.0000 ug | INTRAMUSCULAR | Status: DC
  Administered 2014-07-07: 40 ug via INTRAVENOUS

## 2014-07-07 MED ORDER — DARBEPOETIN ALFA-POLYSORBATE 40 MCG/0.4ML IJ SOLN
INTRAMUSCULAR | Status: AC
  Filled 2014-07-07: qty 0.4

## 2014-07-08 ENCOUNTER — Encounter: Payer: Self-pay | Admitting: Gastroenterology

## 2014-07-14 ENCOUNTER — Encounter (HOSPITAL_COMMUNITY)
Admission: RE | Admit: 2014-07-14 | Discharge: 2014-07-14 | Disposition: A | Discharge status: Home / Self Care | Payer: Medicare Other | Source: Ambulatory Visit | Attending: Nephrology | Admitting: Nephrology

## 2014-07-14 DIAGNOSIS — N184 Chronic kidney disease, stage 4 (severe): Secondary | ICD-10-CM | POA: Insufficient documentation

## 2014-07-14 DIAGNOSIS — D631 Anemia in chronic kidney disease: Secondary | ICD-10-CM | POA: Insufficient documentation

## 2014-07-14 LAB — POCT HEMOGLOBIN-HEMACUE: Hemoglobin: 10.7 g/dL — ABNORMAL LOW (ref 12.0–15.0)

## 2014-07-14 MED ORDER — DARBEPOETIN ALFA-POLYSORBATE 40 MCG/0.4ML IJ SOLN
INTRAMUSCULAR | Status: AC
  Filled 2014-07-14: qty 0.4

## 2014-07-14 MED ORDER — DARBEPOETIN ALFA-POLYSORBATE 40 MCG/0.4ML IJ SOLN
40.0000 ug | INTRAMUSCULAR | Status: DC
  Administered 2014-07-14: 40 ug via INTRAVENOUS

## 2014-07-21 ENCOUNTER — Encounter (HOSPITAL_COMMUNITY)
Admission: RE | Admit: 2014-07-21 | Discharge: 2014-07-21 | Disposition: A | Discharge status: Home / Self Care | Payer: Medicare Other | Source: Ambulatory Visit | Attending: Nephrology | Admitting: Nephrology

## 2014-07-21 DIAGNOSIS — D631 Anemia in chronic kidney disease: Secondary | ICD-10-CM | POA: Diagnosis not present

## 2014-07-21 LAB — POCT HEMOGLOBIN-HEMACUE: Hemoglobin: 11 g/dL — ABNORMAL LOW (ref 12.0–15.0)

## 2014-07-21 MED ORDER — DARBEPOETIN ALFA-POLYSORBATE 40 MCG/0.4ML IJ SOLN
INTRAMUSCULAR | Status: AC
  Administered 2014-07-21: 40 ug via SUBCUTANEOUS
  Filled 2014-07-21: qty 0.4

## 2014-07-21 MED ORDER — DARBEPOETIN ALFA-POLYSORBATE 40 MCG/0.4ML IJ SOLN: 40.0000 ug | INTRAMUSCULAR | Status: DC

## 2014-07-28 ENCOUNTER — Encounter (HOSPITAL_COMMUNITY)
Admission: RE | Admit: 2014-07-28 | Discharge: 2014-07-28 | Disposition: A | Discharge status: Home / Self Care | Payer: Medicare Other | Source: Ambulatory Visit | Attending: Nephrology | Admitting: Nephrology

## 2014-07-28 DIAGNOSIS — D631 Anemia in chronic kidney disease: Secondary | ICD-10-CM | POA: Diagnosis not present

## 2014-07-28 LAB — POCT HEMOGLOBIN-HEMACUE: Hemoglobin: 11.4 g/dL — ABNORMAL LOW (ref 12.0–15.0)

## 2014-07-28 LAB — IRON AND TIBC
IRON: 36 ug/dL — AB (ref 42–135)
Saturation Ratios: 16 % — ABNORMAL LOW (ref 20–55)
TIBC: 224 ug/dL — ABNORMAL LOW (ref 250–470)
UIBC: 188 ug/dL (ref 125–400)

## 2014-07-28 LAB — RENAL FUNCTION PANEL
ALBUMIN: 3.1 g/dL — AB (ref 3.5–5.2)
Anion gap: 18 — ABNORMAL HIGH (ref 5–15)
BUN: 74 mg/dL — ABNORMAL HIGH (ref 6–23)
CHLORIDE: 106 meq/L (ref 96–112)
CO2: 17 mEq/L — ABNORMAL LOW (ref 19–32)
CREATININE: 11.14 mg/dL — AB (ref 0.50–1.10)
Calcium: 8.1 mg/dL — ABNORMAL LOW (ref 8.4–10.5)
GFR calc Af Amer: 3 mL/min — ABNORMAL LOW (ref >90)
GFR, EST NON AFRICAN AMERICAN: 3 mL/min — AB (ref >90)
Glucose, Bld: 85 mg/dL (ref 70–99)
Phosphorus: 6.8 mg/dL — ABNORMAL HIGH (ref 2.3–4.6)
Potassium: 5.1 mEq/L (ref 3.7–5.3)
Sodium: 141 mEq/L (ref 137–147)

## 2014-07-28 LAB — FERRITIN: FERRITIN: 80 ng/mL (ref 10–291)

## 2014-07-28 MED ORDER — DARBEPOETIN ALFA-POLYSORBATE 40 MCG/0.4ML IJ SOLN
40.0000 ug | INTRAMUSCULAR | Status: DC
  Administered 2014-07-28: 40 ug via INTRAVENOUS

## 2014-07-28 MED ORDER — DARBEPOETIN ALFA-POLYSORBATE 40 MCG/0.4ML IJ SOLN
INTRAMUSCULAR | Status: AC
  Filled 2014-07-28: qty 0.4

## 2014-08-04 ENCOUNTER — Encounter (HOSPITAL_COMMUNITY)
Admission: RE | Admit: 2014-08-04 | Discharge: 2014-08-04 | Disposition: A | Discharge status: Home / Self Care | Payer: Medicare Other | Source: Ambulatory Visit | Attending: Nephrology | Admitting: Nephrology

## 2014-08-04 DIAGNOSIS — D631 Anemia in chronic kidney disease: Secondary | ICD-10-CM | POA: Diagnosis not present

## 2014-08-04 LAB — POCT HEMOGLOBIN-HEMACUE: Hemoglobin: 11.5 g/dL — ABNORMAL LOW (ref 12.0–15.0)

## 2014-08-04 MED ORDER — DARBEPOETIN ALFA-POLYSORBATE 40 MCG/0.4ML IJ SOLN
40.0000 ug | INTRAMUSCULAR | Status: DC
  Administered 2014-08-04: 40 ug via INTRAVENOUS

## 2014-08-04 MED ORDER — DARBEPOETIN ALFA-POLYSORBATE 40 MCG/0.4ML IJ SOLN
INTRAMUSCULAR | Status: AC
  Filled 2014-08-04: qty 0.4

## 2014-08-10 ENCOUNTER — Other Ambulatory Visit (HOSPITAL_COMMUNITY): Payer: Self-pay | Admitting: *Deleted

## 2014-08-11 ENCOUNTER — Encounter (HOSPITAL_COMMUNITY)
Admission: RE | Admit: 2014-08-11 | Discharge: 2014-08-11 | Disposition: A | Discharge status: Home / Self Care | Payer: Medicare Other | Source: Ambulatory Visit | Attending: Nephrology | Admitting: Nephrology

## 2014-08-11 DIAGNOSIS — D631 Anemia in chronic kidney disease: Secondary | ICD-10-CM | POA: Diagnosis not present

## 2014-08-11 LAB — POCT HEMOGLOBIN-HEMACUE: Hemoglobin: 12.1 g/dL (ref 12.0–15.0)

## 2014-08-11 MED ORDER — DARBEPOETIN ALFA-POLYSORBATE 40 MCG/0.4ML IJ SOLN: 40.0000 ug | INTRAMUSCULAR | Status: DC

## 2014-08-18 ENCOUNTER — Encounter (HOSPITAL_COMMUNITY): Payer: Medicare Other

## 2014-08-25 ENCOUNTER — Encounter (HOSPITAL_COMMUNITY)
Admission: RE | Admit: 2014-08-25 | Discharge: 2014-08-25 | Disposition: A | Discharge status: Home / Self Care | Payer: Medicare Other | Source: Ambulatory Visit | Attending: Nephrology | Admitting: Nephrology

## 2014-08-25 DIAGNOSIS — N184 Chronic kidney disease, stage 4 (severe): Secondary | ICD-10-CM | POA: Diagnosis not present

## 2014-08-25 DIAGNOSIS — D631 Anemia in chronic kidney disease: Secondary | ICD-10-CM | POA: Insufficient documentation

## 2014-08-25 LAB — IRON AND TIBC
Iron: 81 ug/dL (ref 42–135)
Saturation Ratios: 35 % (ref 20–55)
TIBC: 230 ug/dL — ABNORMAL LOW (ref 250–470)
UIBC: 149 ug/dL (ref 125–400)

## 2014-08-25 LAB — RENAL FUNCTION PANEL
Albumin: 3.1 g/dL — ABNORMAL LOW (ref 3.5–5.2)
Anion gap: 16 — ABNORMAL HIGH (ref 5–15)
BUN: 82 mg/dL — ABNORMAL HIGH (ref 6–23)
CALCIUM: 8.5 mg/dL (ref 8.4–10.5)
CO2: 18 meq/L — AB (ref 19–32)
Chloride: 107 mEq/L (ref 96–112)
Creatinine, Ser: 10.19 mg/dL — ABNORMAL HIGH (ref 0.50–1.10)
GFR calc Af Amer: 4 mL/min — ABNORMAL LOW (ref >90)
GFR, EST NON AFRICAN AMERICAN: 3 mL/min — AB (ref >90)
Glucose, Bld: 85 mg/dL (ref 70–99)
Phosphorus: 6.5 mg/dL — ABNORMAL HIGH (ref 2.3–4.6)
Potassium: 4.9 mEq/L (ref 3.7–5.3)
SODIUM: 141 meq/L (ref 137–147)

## 2014-08-25 LAB — POCT HEMOGLOBIN-HEMACUE: HEMOGLOBIN: 12.2 g/dL (ref 12.0–15.0)

## 2014-08-25 LAB — FERRITIN: Ferritin: 120 ng/mL (ref 10–291)

## 2014-08-25 MED ORDER — DARBEPOETIN ALFA 40 MCG/0.4ML IJ SOSY: 40.0000 ug | PREFILLED_SYRINGE | INTRAMUSCULAR | Status: DC

## 2014-09-08 ENCOUNTER — Encounter (HOSPITAL_COMMUNITY)
Admission: RE | Admit: 2014-09-08 | Discharge: 2014-09-08 | Disposition: A | Discharge status: Home / Self Care | Payer: Medicare Other | Source: Ambulatory Visit | Attending: Nephrology | Admitting: Nephrology

## 2014-09-08 DIAGNOSIS — D631 Anemia in chronic kidney disease: Secondary | ICD-10-CM | POA: Diagnosis not present

## 2014-09-08 LAB — POCT HEMOGLOBIN-HEMACUE: Hemoglobin: 10.7 g/dL — ABNORMAL LOW (ref 12.0–15.0)

## 2014-09-08 MED ORDER — DARBEPOETIN ALFA 40 MCG/0.4ML IJ SOSY
40.0000 ug | PREFILLED_SYRINGE | INTRAMUSCULAR | Status: DC
  Administered 2014-09-08: 40 ug via INTRAVENOUS

## 2014-09-08 MED ORDER — DARBEPOETIN ALFA 40 MCG/0.4ML IJ SOSY
PREFILLED_SYRINGE | INTRAMUSCULAR | Status: AC
  Filled 2014-09-08: qty 0.4

## 2014-09-15 ENCOUNTER — Encounter (HOSPITAL_COMMUNITY)
Admission: RE | Admit: 2014-09-15 | Discharge: 2014-09-15 | Disposition: A | Discharge status: Home / Self Care | Payer: Medicare Other | Source: Ambulatory Visit | Attending: Nephrology | Admitting: Nephrology

## 2014-09-15 DIAGNOSIS — N184 Chronic kidney disease, stage 4 (severe): Secondary | ICD-10-CM | POA: Insufficient documentation

## 2014-09-15 DIAGNOSIS — D631 Anemia in chronic kidney disease: Secondary | ICD-10-CM | POA: Diagnosis not present

## 2014-09-15 LAB — POCT HEMOGLOBIN-HEMACUE: HEMOGLOBIN: 11.4 g/dL — AB (ref 12.0–15.0)

## 2014-09-15 MED ORDER — DARBEPOETIN ALFA 40 MCG/0.4ML IJ SOSY
40.0000 ug | PREFILLED_SYRINGE | INTRAMUSCULAR | Status: DC
  Administered 2014-09-15: 40 ug via INTRAVENOUS

## 2014-09-22 ENCOUNTER — Encounter (HOSPITAL_COMMUNITY)
Admission: RE | Admit: 2014-09-22 | Discharge: 2014-09-22 | Disposition: A | Discharge status: Home / Self Care | Payer: Medicare Other | Source: Ambulatory Visit | Attending: Nephrology | Admitting: Nephrology

## 2014-09-22 DIAGNOSIS — D631 Anemia in chronic kidney disease: Secondary | ICD-10-CM | POA: Diagnosis not present

## 2014-09-22 LAB — RENAL FUNCTION PANEL
ANION GAP: 19 — AB (ref 5–15)
Albumin: 2.9 g/dL — ABNORMAL LOW (ref 3.5–5.2)
BUN: 74 mg/dL — ABNORMAL HIGH (ref 6–23)
CALCIUM: 8.1 mg/dL — AB (ref 8.4–10.5)
CO2: 16 mEq/L — ABNORMAL LOW (ref 19–32)
Chloride: 109 mEq/L (ref 96–112)
Creatinine, Ser: 10.83 mg/dL — ABNORMAL HIGH (ref 0.50–1.10)
GFR calc non Af Amer: 3 mL/min — ABNORMAL LOW (ref >90)
GFR, EST AFRICAN AMERICAN: 3 mL/min — AB (ref >90)
Glucose, Bld: 83 mg/dL (ref 70–99)
PHOSPHORUS: 7.1 mg/dL — AB (ref 2.3–4.6)
POTASSIUM: 4.6 meq/L (ref 3.7–5.3)
SODIUM: 144 meq/L (ref 137–147)

## 2014-09-22 LAB — IRON AND TIBC
Iron: 47 ug/dL (ref 42–135)
Saturation Ratios: 23 % (ref 20–55)
TIBC: 205 ug/dL — ABNORMAL LOW (ref 250–470)
UIBC: 158 ug/dL (ref 125–400)

## 2014-09-22 LAB — POCT HEMOGLOBIN-HEMACUE: Hemoglobin: 11.2 g/dL — ABNORMAL LOW (ref 12.0–15.0)

## 2014-09-22 LAB — FERRITIN: Ferritin: 127 ng/mL (ref 10–291)

## 2014-09-22 MED ORDER — DARBEPOETIN ALFA 40 MCG/0.4ML IJ SOSY
40.0000 ug | PREFILLED_SYRINGE | INTRAMUSCULAR | Status: DC
  Administered 2014-09-22: 40 ug via INTRAVENOUS

## 2014-09-22 MED ORDER — DARBEPOETIN ALFA 40 MCG/0.4ML IJ SOSY
PREFILLED_SYRINGE | INTRAMUSCULAR | Status: AC
  Filled 2014-09-22: qty 0.4

## 2014-09-29 ENCOUNTER — Encounter (HOSPITAL_COMMUNITY)
Admission: RE | Admit: 2014-09-29 | Discharge: 2014-09-29 | Disposition: A | Discharge status: Home / Self Care | Payer: Medicare Other | Source: Ambulatory Visit | Attending: Nephrology | Admitting: Nephrology

## 2014-09-29 DIAGNOSIS — D631 Anemia in chronic kidney disease: Secondary | ICD-10-CM | POA: Diagnosis not present

## 2014-09-29 LAB — POCT HEMOGLOBIN-HEMACUE: Hemoglobin: 11.2 g/dL — ABNORMAL LOW (ref 12.0–15.0)

## 2014-09-29 MED ORDER — DARBEPOETIN ALFA 40 MCG/0.4ML IJ SOSY: 40.0000 ug | PREFILLED_SYRINGE | INTRAMUSCULAR | Status: DC

## 2014-09-29 MED ORDER — DARBEPOETIN ALFA 40 MCG/0.4ML IJ SOSY
PREFILLED_SYRINGE | INTRAMUSCULAR | Status: AC
Start: 2014-09-29 — End: 2014-09-29
  Administered 2014-09-29: 40 ug via SUBCUTANEOUS
  Filled 2014-09-29: qty 0.4

## 2014-09-30 ENCOUNTER — Other Ambulatory Visit: Payer: Self-pay | Admitting: Nurse Practitioner

## 2014-10-09 ENCOUNTER — Encounter (HOSPITAL_COMMUNITY)
Admission: RE | Admit: 2014-10-09 | Discharge: 2014-10-09 | Disposition: A | Discharge status: Home / Self Care | Payer: Medicare Other | Source: Ambulatory Visit | Attending: Nephrology | Admitting: Nephrology

## 2014-10-09 DIAGNOSIS — D631 Anemia in chronic kidney disease: Secondary | ICD-10-CM | POA: Diagnosis not present

## 2014-10-09 LAB — POCT HEMOGLOBIN-HEMACUE: Hemoglobin: 11.4 g/dL — ABNORMAL LOW (ref 12.0–15.0)

## 2014-10-09 MED ORDER — DARBEPOETIN ALFA 40 MCG/0.4ML IJ SOSY
PREFILLED_SYRINGE | INTRAMUSCULAR | Status: AC
Start: 2014-10-09 — End: 2014-10-09
  Administered 2014-10-09: 40 ug via SUBCUTANEOUS
  Filled 2014-10-09: qty 0.4

## 2014-10-16 ENCOUNTER — Encounter (HOSPITAL_COMMUNITY)
Admission: RE | Admit: 2014-10-16 | Discharge: 2014-10-16 | Disposition: A | Discharge status: Home / Self Care | Payer: Medicare Other | Source: Ambulatory Visit | Attending: Nephrology | Admitting: Nephrology

## 2014-10-16 DIAGNOSIS — D631 Anemia in chronic kidney disease: Secondary | ICD-10-CM | POA: Insufficient documentation

## 2014-10-16 DIAGNOSIS — N184 Chronic kidney disease, stage 4 (severe): Secondary | ICD-10-CM | POA: Insufficient documentation

## 2014-10-16 LAB — POCT HEMOGLOBIN-HEMACUE: Hemoglobin: 11.5 g/dL — ABNORMAL LOW (ref 12.0–15.0)

## 2014-10-16 MED ORDER — DARBEPOETIN ALFA 40 MCG/0.4ML IJ SOSY
40.0000 ug | PREFILLED_SYRINGE | INTRAMUSCULAR | Status: DC
  Administered 2014-10-16: 40 ug via INTRAVENOUS

## 2014-10-16 MED ORDER — DARBEPOETIN ALFA 40 MCG/0.4ML IJ SOSY
PREFILLED_SYRINGE | INTRAMUSCULAR | Status: AC
  Filled 2014-10-16: qty 0.4

## 2014-10-23 ENCOUNTER — Encounter (HOSPITAL_COMMUNITY)
Admission: RE | Admit: 2014-10-23 | Discharge: 2014-10-23 | Disposition: A | Discharge status: Home / Self Care | Payer: Medicare Other | Source: Ambulatory Visit | Attending: Nephrology | Admitting: Nephrology

## 2014-10-23 DIAGNOSIS — D631 Anemia in chronic kidney disease: Secondary | ICD-10-CM | POA: Diagnosis not present

## 2014-10-23 LAB — RENAL FUNCTION PANEL
ALBUMIN: 3.2 g/dL — AB (ref 3.5–5.2)
Anion gap: 8 (ref 5–15)
BUN: 75 mg/dL — AB (ref 6–23)
CO2: 19 mmol/L (ref 19–32)
Calcium: 7.5 mg/dL — ABNORMAL LOW (ref 8.4–10.5)
Chloride: 114 mEq/L — ABNORMAL HIGH (ref 96–112)
Creatinine, Ser: 10.75 mg/dL — ABNORMAL HIGH (ref 0.50–1.10)
GFR calc non Af Amer: 3 mL/min — ABNORMAL LOW (ref >90)
GFR, EST AFRICAN AMERICAN: 3 mL/min — AB (ref >90)
GLUCOSE: 88 mg/dL (ref 70–99)
PHOSPHORUS: 7.6 mg/dL — AB (ref 2.3–4.6)
Potassium: 4.3 mmol/L (ref 3.5–5.1)
Sodium: 141 mmol/L (ref 135–145)

## 2014-10-23 LAB — IRON AND TIBC
Iron: 67 ug/dL (ref 42–145)
SATURATION RATIOS: 32 % (ref 20–55)
TIBC: 209 ug/dL — ABNORMAL LOW (ref 250–470)
UIBC: 142 ug/dL (ref 125–400)

## 2014-10-23 LAB — POCT HEMOGLOBIN-HEMACUE: HEMOGLOBIN: 11.1 g/dL — AB (ref 12.0–15.0)

## 2014-10-23 LAB — FERRITIN: Ferritin: 75 ng/mL (ref 10–291)

## 2014-10-23 MED ORDER — DARBEPOETIN ALFA 40 MCG/0.4ML IJ SOSY
PREFILLED_SYRINGE | INTRAMUSCULAR | Status: AC
  Filled 2014-10-23: qty 0.4

## 2014-10-23 MED ORDER — DARBEPOETIN ALFA 40 MCG/0.4ML IJ SOSY
40.0000 ug | PREFILLED_SYRINGE | INTRAMUSCULAR | Status: DC
  Administered 2014-10-23: 40 ug via INTRAVENOUS

## 2014-10-30 ENCOUNTER — Encounter (HOSPITAL_COMMUNITY)
Admission: RE | Admit: 2014-10-30 | Discharge: 2014-10-30 | Disposition: A | Discharge status: Home / Self Care | Payer: Medicare Other | Source: Ambulatory Visit | Attending: Nephrology | Admitting: Nephrology

## 2014-10-30 DIAGNOSIS — D631 Anemia in chronic kidney disease: Secondary | ICD-10-CM | POA: Diagnosis not present

## 2014-10-30 LAB — POCT HEMOGLOBIN-HEMACUE: HEMOGLOBIN: 11.7 g/dL — AB (ref 12.0–15.0)

## 2014-10-30 MED ORDER — DARBEPOETIN ALFA 40 MCG/0.4ML IJ SOSY
PREFILLED_SYRINGE | INTRAMUSCULAR | Status: AC
  Filled 2014-10-30: qty 0.4

## 2014-10-30 MED ORDER — DARBEPOETIN ALFA 40 MCG/0.4ML IJ SOSY
40.0000 ug | PREFILLED_SYRINGE | INTRAMUSCULAR | Status: DC
  Administered 2014-10-30: 40 ug via INTRAVENOUS

## 2014-11-06 ENCOUNTER — Encounter (HOSPITAL_COMMUNITY): Payer: Medicare Other

## 2014-11-08 ENCOUNTER — Encounter (HOSPITAL_COMMUNITY)
Admission: RE | Admit: 2014-11-08 | Discharge: 2014-11-08 | Disposition: A | Discharge status: Home / Self Care | Payer: Medicare Other | Source: Ambulatory Visit | Attending: Nephrology | Admitting: Nephrology

## 2014-11-08 DIAGNOSIS — D631 Anemia in chronic kidney disease: Secondary | ICD-10-CM | POA: Diagnosis not present

## 2014-11-08 LAB — POCT HEMOGLOBIN-HEMACUE: HEMOGLOBIN: 11.4 g/dL — AB (ref 12.0–15.0)

## 2014-11-08 MED ORDER — DARBEPOETIN ALFA 40 MCG/0.4ML IJ SOSY
40.0000 ug | PREFILLED_SYRINGE | INTRAMUSCULAR | Status: DC
  Administered 2014-11-08: 40 ug via INTRAVENOUS

## 2014-11-08 MED ORDER — DARBEPOETIN ALFA 40 MCG/0.4ML IJ SOSY
PREFILLED_SYRINGE | INTRAMUSCULAR | Status: AC
  Administered 2014-11-08: 40 ug via INTRAVENOUS
  Filled 2014-11-08: qty 0.4

## 2014-11-13 ENCOUNTER — Encounter (HOSPITAL_COMMUNITY)
Admission: RE | Admit: 2014-11-13 | Discharge: 2014-11-13 | Disposition: A | Discharge status: Home / Self Care | Payer: Medicare Other | Source: Ambulatory Visit | Attending: Nephrology | Admitting: Nephrology

## 2014-11-13 DIAGNOSIS — D631 Anemia in chronic kidney disease: Secondary | ICD-10-CM | POA: Insufficient documentation

## 2014-11-13 DIAGNOSIS — N184 Chronic kidney disease, stage 4 (severe): Secondary | ICD-10-CM | POA: Diagnosis not present

## 2014-11-13 LAB — POCT HEMOGLOBIN-HEMACUE: HEMOGLOBIN: 11 g/dL — AB (ref 12.0–15.0)

## 2014-11-13 MED ORDER — DARBEPOETIN ALFA 40 MCG/0.4ML IJ SOSY
40.0000 ug | PREFILLED_SYRINGE | INTRAMUSCULAR | Status: DC
  Administered 2014-11-13: 40 ug via INTRAVENOUS

## 2014-11-13 MED ORDER — DARBEPOETIN ALFA 40 MCG/0.4ML IJ SOSY
PREFILLED_SYRINGE | INTRAMUSCULAR | Status: AC
  Filled 2014-11-13: qty 0.4

## 2014-11-20 ENCOUNTER — Encounter (HOSPITAL_COMMUNITY)
Admission: RE | Admit: 2014-11-20 | Discharge: 2014-11-20 | Disposition: A | Discharge status: Home / Self Care | Payer: Medicare Other | Source: Ambulatory Visit | Attending: Nephrology | Admitting: Nephrology

## 2014-11-20 DIAGNOSIS — D631 Anemia in chronic kidney disease: Secondary | ICD-10-CM | POA: Diagnosis not present

## 2014-11-20 LAB — RENAL FUNCTION PANEL
Albumin: 3.2 g/dL — ABNORMAL LOW (ref 3.5–5.2)
Anion gap: 9 (ref 5–15)
BUN: 74 mg/dL — AB (ref 6–23)
CHLORIDE: 113 mmol/L — AB (ref 96–112)
CO2: 20 mmol/L (ref 19–32)
Calcium: 7.8 mg/dL — ABNORMAL LOW (ref 8.4–10.5)
Creatinine, Ser: 10.78 mg/dL — ABNORMAL HIGH (ref 0.50–1.10)
GFR calc Af Amer: 3 mL/min — ABNORMAL LOW (ref >90)
GFR calc non Af Amer: 3 mL/min — ABNORMAL LOW (ref >90)
GLUCOSE: 101 mg/dL — AB (ref 70–99)
Phosphorus: 6.8 mg/dL — ABNORMAL HIGH (ref 2.3–4.6)
Potassium: 4.2 mmol/L (ref 3.5–5.1)
Sodium: 142 mmol/L (ref 135–145)

## 2014-11-20 MED ORDER — DARBEPOETIN ALFA 40 MCG/0.4ML IJ SOSY
PREFILLED_SYRINGE | INTRAMUSCULAR | Status: AC
  Filled 2014-11-20: qty 0.4

## 2014-11-20 MED ORDER — DARBEPOETIN ALFA 40 MCG/0.4ML IJ SOSY
40.0000 ug | PREFILLED_SYRINGE | INTRAMUSCULAR | Status: DC
  Administered 2014-11-20: 40 ug via INTRAVENOUS

## 2014-11-21 LAB — IRON AND TIBC
Iron: 46 ug/dL (ref 42–145)
Saturation Ratios: 21 % (ref 20–55)
TIBC: 218 ug/dL — AB (ref 250–470)
UIBC: 172 ug/dL (ref 125–400)

## 2014-11-21 LAB — POCT HEMOGLOBIN-HEMACUE: Hemoglobin: 11.6 g/dL — ABNORMAL LOW (ref 12.0–15.0)

## 2014-11-21 LAB — FERRITIN: Ferritin: 55 ng/mL (ref 10–291)

## 2014-11-27 ENCOUNTER — Inpatient Hospital Stay (HOSPITAL_COMMUNITY): Admission: RE | Admit: 2014-11-27 | Payer: Medicare Other | Source: Ambulatory Visit

## 2014-11-29 ENCOUNTER — Encounter (HOSPITAL_COMMUNITY): Payer: Medicare Other

## 2014-12-11 ENCOUNTER — Other Ambulatory Visit (HOSPITAL_COMMUNITY): Payer: Self-pay | Admitting: *Deleted

## 2014-12-11 ENCOUNTER — Encounter (HOSPITAL_COMMUNITY)
Admission: RE | Admit: 2014-12-11 | Discharge: 2014-12-11 | Disposition: A | Discharge status: Home / Self Care | Payer: Medicare Other | Source: Ambulatory Visit | Attending: Nephrology | Admitting: Nephrology

## 2014-12-11 DIAGNOSIS — D631 Anemia in chronic kidney disease: Secondary | ICD-10-CM | POA: Diagnosis not present

## 2014-12-11 LAB — POCT HEMOGLOBIN-HEMACUE: HEMOGLOBIN: 11.4 g/dL — AB (ref 12.0–15.0)

## 2014-12-11 MED ORDER — DARBEPOETIN ALFA 40 MCG/0.4ML IJ SOSY
40.0000 ug | PREFILLED_SYRINGE | INTRAMUSCULAR | Status: DC
  Administered 2014-12-11: 40 ug via INTRAVENOUS

## 2014-12-12 ENCOUNTER — Encounter (HOSPITAL_COMMUNITY): Payer: Medicare Other

## 2014-12-12 MED ORDER — DARBEPOETIN ALFA 40 MCG/0.4ML IJ SOSY
PREFILLED_SYRINGE | INTRAMUSCULAR | Status: AC
  Filled 2014-12-12: qty 0.4

## 2014-12-18 ENCOUNTER — Encounter (HOSPITAL_COMMUNITY)
Admission: RE | Admit: 2014-12-18 | Discharge: 2014-12-18 | Disposition: A | Discharge status: Home / Self Care | Payer: Medicare Other | Source: Ambulatory Visit | Attending: Nephrology | Admitting: Nephrology

## 2014-12-18 DIAGNOSIS — N184 Chronic kidney disease, stage 4 (severe): Secondary | ICD-10-CM | POA: Diagnosis not present

## 2014-12-18 DIAGNOSIS — D631 Anemia in chronic kidney disease: Secondary | ICD-10-CM | POA: Insufficient documentation

## 2014-12-18 LAB — RENAL FUNCTION PANEL
Albumin: 3.1 g/dL — ABNORMAL LOW (ref 3.5–5.2)
Anion gap: 14 (ref 5–15)
BUN: 82 mg/dL — AB (ref 6–23)
CO2: 16 mmol/L — AB (ref 19–32)
CREATININE: 11.42 mg/dL — AB (ref 0.50–1.10)
Calcium: 7.8 mg/dL — ABNORMAL LOW (ref 8.4–10.5)
Chloride: 110 mmol/L (ref 96–112)
GFR calc Af Amer: 3 mL/min — ABNORMAL LOW (ref >90)
GFR, EST NON AFRICAN AMERICAN: 3 mL/min — AB (ref >90)
Glucose, Bld: 115 mg/dL — ABNORMAL HIGH (ref 70–99)
Phosphorus: 6.6 mg/dL — ABNORMAL HIGH (ref 2.3–4.6)
Potassium: 4.1 mmol/L (ref 3.5–5.1)
Sodium: 140 mmol/L (ref 135–145)

## 2014-12-18 MED ORDER — DARBEPOETIN ALFA 40 MCG/0.4ML IJ SOSY
40.0000 ug | PREFILLED_SYRINGE | INTRAMUSCULAR | Status: DC
  Administered 2014-12-18: 40 ug via INTRAVENOUS

## 2014-12-19 LAB — FERRITIN: FERRITIN: 86 ng/mL (ref 10–291)

## 2014-12-19 LAB — IRON AND TIBC
IRON: 38 ug/dL — AB (ref 42–145)
Saturation Ratios: 17 % — ABNORMAL LOW (ref 20–55)
TIBC: 218 ug/dL — ABNORMAL LOW (ref 250–470)
UIBC: 180 ug/dL (ref 125–400)

## 2014-12-19 LAB — POCT HEMOGLOBIN-HEMACUE: HEMOGLOBIN: 11.1 g/dL — AB (ref 12.0–15.0)

## 2014-12-19 MED ORDER — DARBEPOETIN ALFA 40 MCG/0.4ML IJ SOSY
PREFILLED_SYRINGE | INTRAMUSCULAR | Status: AC
  Filled 2014-12-19: qty 0.4

## 2014-12-25 ENCOUNTER — Encounter (HOSPITAL_COMMUNITY)
Admission: RE | Admit: 2014-12-25 | Discharge: 2014-12-25 | Disposition: A | Discharge status: Home / Self Care | Payer: Medicare Other | Source: Ambulatory Visit | Attending: Nephrology | Admitting: Nephrology

## 2014-12-25 DIAGNOSIS — D631 Anemia in chronic kidney disease: Secondary | ICD-10-CM | POA: Diagnosis not present

## 2014-12-25 LAB — POCT HEMOGLOBIN-HEMACUE: Hemoglobin: 11.3 g/dL — ABNORMAL LOW (ref 12.0–15.0)

## 2014-12-25 MED ORDER — DARBEPOETIN ALFA 40 MCG/0.4ML IJ SOSY
40.0000 ug | PREFILLED_SYRINGE | INTRAMUSCULAR | Status: DC
  Administered 2014-12-25: 40 ug via INTRAVENOUS

## 2014-12-26 MED ORDER — DARBEPOETIN ALFA 40 MCG/0.4ML IJ SOSY
PREFILLED_SYRINGE | INTRAMUSCULAR | Status: AC
  Filled 2014-12-26: qty 0.4

## 2015-01-01 ENCOUNTER — Encounter (HOSPITAL_COMMUNITY)
Admission: RE | Admit: 2015-01-01 | Discharge: 2015-01-01 | Disposition: A | Discharge status: Home / Self Care | Payer: Medicare Other | Source: Ambulatory Visit | Attending: Nephrology | Admitting: Nephrology

## 2015-01-01 DIAGNOSIS — D631 Anemia in chronic kidney disease: Secondary | ICD-10-CM | POA: Diagnosis not present

## 2015-01-01 LAB — POCT HEMOGLOBIN-HEMACUE: Hemoglobin: 11.7 g/dL — ABNORMAL LOW (ref 12.0–15.0)

## 2015-01-01 MED ORDER — DARBEPOETIN ALFA 40 MCG/0.4ML IJ SOSY
40.0000 ug | PREFILLED_SYRINGE | INTRAMUSCULAR | Status: DC
  Administered 2015-01-01: 40 ug via INTRAVENOUS

## 2015-01-02 MED ORDER — DARBEPOETIN ALFA 40 MCG/0.4ML IJ SOSY
PREFILLED_SYRINGE | INTRAMUSCULAR | Status: AC
  Filled 2015-01-02: qty 0.4

## 2015-01-15 ENCOUNTER — Encounter (HOSPITAL_COMMUNITY)
Admission: RE | Admit: 2015-01-15 | Discharge: 2015-01-15 | Disposition: A | Discharge status: Home / Self Care | Payer: Medicare Other | Source: Ambulatory Visit | Attending: Nephrology | Admitting: Nephrology

## 2015-01-15 DIAGNOSIS — Z79899 Other long term (current) drug therapy: Secondary | ICD-10-CM | POA: Diagnosis not present

## 2015-01-15 DIAGNOSIS — D631 Anemia in chronic kidney disease: Secondary | ICD-10-CM | POA: Insufficient documentation

## 2015-01-15 DIAGNOSIS — Z5181 Encounter for therapeutic drug level monitoring: Secondary | ICD-10-CM | POA: Insufficient documentation

## 2015-01-15 DIAGNOSIS — N184 Chronic kidney disease, stage 4 (severe): Secondary | ICD-10-CM | POA: Diagnosis not present

## 2015-01-15 LAB — RENAL FUNCTION PANEL
ALBUMIN: 3.1 g/dL — AB (ref 3.5–5.2)
Anion gap: 12 (ref 5–15)
BUN: 79 mg/dL — ABNORMAL HIGH (ref 6–23)
CALCIUM: 7.9 mg/dL — AB (ref 8.4–10.5)
CO2: 19 mmol/L (ref 19–32)
CREATININE: 10.78 mg/dL — AB (ref 0.50–1.10)
Chloride: 108 mmol/L (ref 96–112)
GFR calc Af Amer: 3 mL/min — ABNORMAL LOW (ref >90)
GFR calc non Af Amer: 3 mL/min — ABNORMAL LOW (ref >90)
Glucose, Bld: 108 mg/dL — ABNORMAL HIGH (ref 70–99)
PHOSPHORUS: 6.1 mg/dL — AB (ref 2.3–4.6)
Potassium: 4.5 mmol/L (ref 3.5–5.1)
SODIUM: 139 mmol/L (ref 135–145)

## 2015-01-15 LAB — POCT HEMOGLOBIN-HEMACUE: Hemoglobin: 11.2 g/dL — ABNORMAL LOW (ref 12.0–15.0)

## 2015-01-15 MED ORDER — DARBEPOETIN ALFA 40 MCG/0.4ML IJ SOSY
PREFILLED_SYRINGE | INTRAMUSCULAR | Status: AC
  Filled 2015-01-15: qty 0.4

## 2015-01-15 MED ORDER — DARBEPOETIN ALFA 40 MCG/0.4ML IJ SOSY
40.0000 ug | PREFILLED_SYRINGE | INTRAMUSCULAR | Status: DC
  Administered 2015-01-15: 40 ug via INTRAVENOUS

## 2015-01-16 LAB — IRON AND TIBC
Iron: 65 ug/dL (ref 42–145)
Saturation Ratios: 28 % (ref 20–55)
TIBC: 232 ug/dL — ABNORMAL LOW (ref 250–470)
UIBC: 167 ug/dL (ref 125–400)

## 2015-01-16 LAB — FERRITIN: Ferritin: 54 ng/mL (ref 10–291)

## 2015-01-19 ENCOUNTER — Other Ambulatory Visit (HOSPITAL_COMMUNITY): Payer: Self-pay | Admitting: *Deleted

## 2015-01-22 ENCOUNTER — Inpatient Hospital Stay (HOSPITAL_COMMUNITY): Admission: RE | Admit: 2015-01-22 | Payer: Medicare Other | Source: Ambulatory Visit

## 2015-02-12 ENCOUNTER — Encounter (HOSPITAL_COMMUNITY)
Admission: RE | Admit: 2015-02-12 | Discharge: 2015-02-12 | Disposition: A | Discharge status: Home / Self Care | Payer: Medicare Other | Source: Ambulatory Visit | Attending: Nephrology | Admitting: Nephrology

## 2015-02-12 DIAGNOSIS — Z79899 Other long term (current) drug therapy: Secondary | ICD-10-CM | POA: Diagnosis not present

## 2015-02-12 DIAGNOSIS — N184 Chronic kidney disease, stage 4 (severe): Secondary | ICD-10-CM | POA: Diagnosis not present

## 2015-02-12 DIAGNOSIS — D631 Anemia in chronic kidney disease: Secondary | ICD-10-CM | POA: Insufficient documentation

## 2015-02-12 DIAGNOSIS — Z5181 Encounter for therapeutic drug level monitoring: Secondary | ICD-10-CM | POA: Insufficient documentation

## 2015-02-12 LAB — RENAL FUNCTION PANEL
ALBUMIN: 3.2 g/dL — AB (ref 3.5–5.0)
Anion gap: 13 (ref 5–15)
BUN: 81 mg/dL — AB (ref 6–20)
CHLORIDE: 110 mmol/L (ref 101–111)
CO2: 18 mmol/L — ABNORMAL LOW (ref 22–32)
CREATININE: 9.72 mg/dL — AB (ref 0.44–1.00)
Calcium: 8.5 mg/dL — ABNORMAL LOW (ref 8.9–10.3)
GFR calc Af Amer: 4 mL/min — ABNORMAL LOW (ref >60)
GFR calc non Af Amer: 3 mL/min — ABNORMAL LOW (ref >60)
Glucose, Bld: 121 mg/dL — ABNORMAL HIGH (ref 70–99)
POTASSIUM: 4.6 mmol/L (ref 3.5–5.1)
Phosphorus: 5.2 mg/dL — ABNORMAL HIGH (ref 2.5–4.6)
Sodium: 141 mmol/L (ref 135–145)

## 2015-02-12 LAB — IRON AND TIBC
IRON: 81 ug/dL (ref 28–170)
SATURATION RATIOS: 31 % (ref 10.4–31.8)
TIBC: 265 ug/dL (ref 250–450)
UIBC: 184 ug/dL

## 2015-02-12 LAB — FERRITIN: Ferritin: 94 ng/mL (ref 11–307)

## 2015-02-12 LAB — POCT HEMOGLOBIN-HEMACUE: HEMOGLOBIN: 11.3 g/dL — AB (ref 12.0–15.0)

## 2015-02-12 MED ORDER — DARBEPOETIN ALFA 40 MCG/0.4ML IJ SOSY
PREFILLED_SYRINGE | INTRAMUSCULAR | Status: AC
  Filled 2015-02-12: qty 0.4

## 2015-02-12 MED ORDER — DARBEPOETIN ALFA 40 MCG/0.4ML IJ SOSY
40.0000 ug | PREFILLED_SYRINGE | INTRAMUSCULAR | Status: DC
  Administered 2015-02-12: 40 ug via INTRAVENOUS

## 2015-02-23 ENCOUNTER — Encounter: Payer: Self-pay | Admitting: Gastroenterology

## 2015-03-13 ENCOUNTER — Encounter (HOSPITAL_COMMUNITY)
Admission: RE | Admit: 2015-03-13 | Discharge: 2015-03-13 | Disposition: A | Discharge status: Home / Self Care | Payer: Medicare Other | Source: Ambulatory Visit | Attending: Nephrology | Admitting: Nephrology

## 2015-03-13 DIAGNOSIS — N184 Chronic kidney disease, stage 4 (severe): Secondary | ICD-10-CM | POA: Diagnosis not present

## 2015-03-13 LAB — IRON AND TIBC
IRON: 76 ug/dL (ref 28–170)
Saturation Ratios: 31 % (ref 10.4–31.8)
TIBC: 248 ug/dL — AB (ref 250–450)
UIBC: 172 ug/dL

## 2015-03-13 LAB — RENAL FUNCTION PANEL
ALBUMIN: 3.3 g/dL — AB (ref 3.5–5.0)
Anion gap: 12 (ref 5–15)
BUN: 74 mg/dL — ABNORMAL HIGH (ref 6–20)
CHLORIDE: 110 mmol/L (ref 101–111)
CO2: 18 mmol/L — ABNORMAL LOW (ref 22–32)
CREATININE: 10.27 mg/dL — AB (ref 0.44–1.00)
Calcium: 7.8 mg/dL — ABNORMAL LOW (ref 8.9–10.3)
GFR calc Af Amer: 4 mL/min — ABNORMAL LOW (ref >60)
GFR calc non Af Amer: 3 mL/min — ABNORMAL LOW (ref >60)
Glucose, Bld: 95 mg/dL (ref 65–99)
POTASSIUM: 4.3 mmol/L (ref 3.5–5.1)
Phosphorus: 6.5 mg/dL — ABNORMAL HIGH (ref 2.5–4.6)
Sodium: 140 mmol/L (ref 135–145)

## 2015-03-13 LAB — POCT HEMOGLOBIN-HEMACUE: Hemoglobin: 10.2 g/dL — ABNORMAL LOW (ref 12.0–15.0)

## 2015-03-13 LAB — FERRITIN: FERRITIN: 141 ng/mL (ref 11–307)

## 2015-03-13 MED ORDER — DARBEPOETIN ALFA 40 MCG/0.4ML IJ SOSY
PREFILLED_SYRINGE | INTRAMUSCULAR | Status: AC
  Administered 2015-03-13: 40 ug via SUBCUTANEOUS
  Filled 2015-03-13: qty 0.4

## 2015-03-13 MED ORDER — DARBEPOETIN ALFA 40 MCG/0.4ML IJ SOSY: 40.0000 ug | PREFILLED_SYRINGE | INTRAMUSCULAR | Status: DC

## 2015-04-10 ENCOUNTER — Encounter (HOSPITAL_COMMUNITY)
Admission: RE | Admit: 2015-04-10 | Discharge: 2015-04-10 | Disposition: A | Discharge status: Home / Self Care | Payer: Medicare Other | Source: Ambulatory Visit | Attending: Nephrology | Admitting: Nephrology

## 2015-04-10 DIAGNOSIS — N184 Chronic kidney disease, stage 4 (severe): Secondary | ICD-10-CM | POA: Diagnosis present

## 2015-04-10 DIAGNOSIS — D631 Anemia in chronic kidney disease: Secondary | ICD-10-CM | POA: Diagnosis not present

## 2015-04-10 DIAGNOSIS — Z79899 Other long term (current) drug therapy: Secondary | ICD-10-CM | POA: Diagnosis not present

## 2015-04-10 DIAGNOSIS — Z5181 Encounter for therapeutic drug level monitoring: Secondary | ICD-10-CM | POA: Insufficient documentation

## 2015-04-10 LAB — IRON AND TIBC
Iron: 71 ug/dL (ref 28–170)
SATURATION RATIOS: 28 % (ref 10.4–31.8)
TIBC: 251 ug/dL (ref 250–450)
UIBC: 180 ug/dL

## 2015-04-10 LAB — RENAL FUNCTION PANEL
Albumin: 3.2 g/dL — ABNORMAL LOW (ref 3.5–5.0)
Anion gap: 8 (ref 5–15)
BUN: 85 mg/dL — AB (ref 6–20)
CO2: 20 mmol/L — AB (ref 22–32)
Calcium: 7.6 mg/dL — ABNORMAL LOW (ref 8.9–10.3)
Chloride: 114 mmol/L — ABNORMAL HIGH (ref 101–111)
Creatinine, Ser: 10.52 mg/dL — ABNORMAL HIGH (ref 0.44–1.00)
GFR calc non Af Amer: 3 mL/min — ABNORMAL LOW (ref >60)
GFR, EST AFRICAN AMERICAN: 3 mL/min — AB (ref >60)
GLUCOSE: 87 mg/dL (ref 65–99)
Phosphorus: 6.3 mg/dL — ABNORMAL HIGH (ref 2.5–4.6)
Potassium: 4.5 mmol/L (ref 3.5–5.1)
Sodium: 142 mmol/L (ref 135–145)

## 2015-04-10 LAB — FERRITIN: FERRITIN: 144 ng/mL (ref 11–307)

## 2015-04-10 LAB — POCT HEMOGLOBIN-HEMACUE: Hemoglobin: 9.6 g/dL — ABNORMAL LOW (ref 12.0–15.0)

## 2015-04-10 MED ORDER — DARBEPOETIN ALFA 40 MCG/0.4ML IJ SOSY
40.0000 ug | PREFILLED_SYRINGE | INTRAMUSCULAR | Status: DC
  Administered 2015-04-10: 40 ug via INTRAVENOUS

## 2015-04-10 MED ORDER — DARBEPOETIN ALFA 40 MCG/0.4ML IJ SOSY
PREFILLED_SYRINGE | INTRAMUSCULAR | Status: AC
  Filled 2015-04-10: qty 0.4

## 2015-05-08 ENCOUNTER — Encounter (HOSPITAL_COMMUNITY)
Admission: RE | Admit: 2015-05-08 | Discharge: 2015-05-08 | Disposition: A | Discharge status: Home / Self Care | Payer: Medicare Other | Source: Ambulatory Visit | Attending: Nephrology | Admitting: Nephrology

## 2015-05-08 DIAGNOSIS — Z5181 Encounter for therapeutic drug level monitoring: Secondary | ICD-10-CM | POA: Insufficient documentation

## 2015-05-08 DIAGNOSIS — Z79899 Other long term (current) drug therapy: Secondary | ICD-10-CM | POA: Diagnosis not present

## 2015-05-08 DIAGNOSIS — N184 Chronic kidney disease, stage 4 (severe): Secondary | ICD-10-CM | POA: Insufficient documentation

## 2015-05-08 DIAGNOSIS — D631 Anemia in chronic kidney disease: Secondary | ICD-10-CM | POA: Insufficient documentation

## 2015-05-08 LAB — IRON AND TIBC
Iron: 72 ug/dL (ref 28–170)
Saturation Ratios: 28 % (ref 10.4–31.8)
TIBC: 256 ug/dL (ref 250–450)
UIBC: 184 ug/dL

## 2015-05-08 LAB — RENAL FUNCTION PANEL
Albumin: 3.2 g/dL — ABNORMAL LOW (ref 3.5–5.0)
Anion gap: 13 (ref 5–15)
BUN: 86 mg/dL — ABNORMAL HIGH (ref 6–20)
CALCIUM: 7.5 mg/dL — AB (ref 8.9–10.3)
CO2: 18 mmol/L — AB (ref 22–32)
Chloride: 105 mmol/L (ref 101–111)
Creatinine, Ser: 10.57 mg/dL — ABNORMAL HIGH (ref 0.44–1.00)
GFR calc Af Amer: 3 mL/min — ABNORMAL LOW (ref >60)
GFR calc non Af Amer: 3 mL/min — ABNORMAL LOW (ref >60)
Glucose, Bld: 119 mg/dL — ABNORMAL HIGH (ref 65–99)
Phosphorus: 6.4 mg/dL — ABNORMAL HIGH (ref 2.5–4.6)
Potassium: 3.9 mmol/L (ref 3.5–5.1)
Sodium: 136 mmol/L (ref 135–145)

## 2015-05-08 LAB — POCT HEMOGLOBIN-HEMACUE: HEMOGLOBIN: 9.2 g/dL — AB (ref 12.0–15.0)

## 2015-05-08 LAB — FERRITIN: Ferritin: 115 ng/mL (ref 11–307)

## 2015-05-08 MED ORDER — DARBEPOETIN ALFA 40 MCG/0.4ML IJ SOSY
PREFILLED_SYRINGE | INTRAMUSCULAR | Status: AC
  Filled 2015-05-08: qty 0.4

## 2015-05-08 MED ORDER — DARBEPOETIN ALFA 40 MCG/0.4ML IJ SOSY
40.0000 ug | PREFILLED_SYRINGE | INTRAMUSCULAR | Status: DC
  Administered 2015-05-08: 40 ug via INTRAVENOUS

## 2015-06-05 ENCOUNTER — Encounter (HOSPITAL_COMMUNITY)
Admission: RE | Admit: 2015-06-05 | Discharge: 2015-06-05 | Disposition: A | Discharge status: Home / Self Care | Payer: Medicare Other | Source: Ambulatory Visit | Attending: Nephrology | Admitting: Nephrology

## 2015-06-05 DIAGNOSIS — N184 Chronic kidney disease, stage 4 (severe): Secondary | ICD-10-CM | POA: Diagnosis not present

## 2015-06-05 DIAGNOSIS — D631 Anemia in chronic kidney disease: Secondary | ICD-10-CM | POA: Diagnosis not present

## 2015-06-05 DIAGNOSIS — Z5181 Encounter for therapeutic drug level monitoring: Secondary | ICD-10-CM | POA: Diagnosis not present

## 2015-06-05 DIAGNOSIS — Z79899 Other long term (current) drug therapy: Secondary | ICD-10-CM | POA: Insufficient documentation

## 2015-06-05 LAB — RENAL FUNCTION PANEL
ALBUMIN: 3.4 g/dL — AB (ref 3.5–5.0)
Anion gap: 11 (ref 5–15)
BUN: 82 mg/dL — AB (ref 6–20)
CALCIUM: 7.3 mg/dL — AB (ref 8.9–10.3)
CO2: 18 mmol/L — ABNORMAL LOW (ref 22–32)
Chloride: 111 mmol/L (ref 101–111)
Creatinine, Ser: 10.4 mg/dL — ABNORMAL HIGH (ref 0.44–1.00)
GFR calc Af Amer: 4 mL/min — ABNORMAL LOW (ref >60)
GFR calc non Af Amer: 3 mL/min — ABNORMAL LOW (ref >60)
GLUCOSE: 85 mg/dL (ref 65–99)
PHOSPHORUS: 8 mg/dL — AB (ref 2.5–4.6)
Potassium: 4 mmol/L (ref 3.5–5.1)
Sodium: 140 mmol/L (ref 135–145)

## 2015-06-05 LAB — POCT HEMOGLOBIN-HEMACUE: HEMOGLOBIN: 9.7 g/dL — AB (ref 12.0–15.0)

## 2015-06-05 LAB — IRON AND TIBC
Iron: 67 ug/dL (ref 28–170)
Saturation Ratios: 26 % (ref 10.4–31.8)
TIBC: 259 ug/dL (ref 250–450)
UIBC: 192 ug/dL

## 2015-06-05 LAB — FERRITIN: Ferritin: 113 ng/mL (ref 11–307)

## 2015-06-05 MED ORDER — DARBEPOETIN ALFA 40 MCG/0.4ML IJ SOSY
40.0000 ug | PREFILLED_SYRINGE | INTRAMUSCULAR | Status: DC
  Administered 2015-06-05: 40 ug via INTRAVENOUS

## 2015-06-05 MED ORDER — DARBEPOETIN ALFA 40 MCG/0.4ML IJ SOSY
PREFILLED_SYRINGE | INTRAMUSCULAR | Status: AC
  Administered 2015-06-05: 40 ug via INTRAVENOUS
  Filled 2015-06-05: qty 0.4

## 2015-07-03 ENCOUNTER — Encounter (HOSPITAL_COMMUNITY)
Admission: RE | Admit: 2015-07-03 | Discharge: 2015-07-03 | Disposition: A | Discharge status: Home / Self Care | Payer: Medicare Other | Source: Ambulatory Visit | Attending: Nephrology | Admitting: Nephrology

## 2015-07-03 DIAGNOSIS — Z5181 Encounter for therapeutic drug level monitoring: Secondary | ICD-10-CM | POA: Diagnosis not present

## 2015-07-03 DIAGNOSIS — Z79899 Other long term (current) drug therapy: Secondary | ICD-10-CM | POA: Diagnosis not present

## 2015-07-03 DIAGNOSIS — D631 Anemia in chronic kidney disease: Secondary | ICD-10-CM | POA: Insufficient documentation

## 2015-07-03 DIAGNOSIS — N184 Chronic kidney disease, stage 4 (severe): Secondary | ICD-10-CM | POA: Insufficient documentation

## 2015-07-03 LAB — RENAL FUNCTION PANEL
ALBUMIN: 3.3 g/dL — AB (ref 3.5–5.0)
Anion gap: 14 (ref 5–15)
BUN: 93 mg/dL — AB (ref 6–20)
CO2: 19 mmol/L — AB (ref 22–32)
Calcium: 7.7 mg/dL — ABNORMAL LOW (ref 8.9–10.3)
Chloride: 107 mmol/L (ref 101–111)
Creatinine, Ser: 10.68 mg/dL — ABNORMAL HIGH (ref 0.44–1.00)
GFR calc Af Amer: 3 mL/min — ABNORMAL LOW (ref >60)
GFR calc non Af Amer: 3 mL/min — ABNORMAL LOW (ref >60)
GLUCOSE: 146 mg/dL — AB (ref 65–99)
PHOSPHORUS: 6.7 mg/dL — AB (ref 2.5–4.6)
POTASSIUM: 4.1 mmol/L (ref 3.5–5.1)
SODIUM: 140 mmol/L (ref 135–145)

## 2015-07-03 LAB — IRON AND TIBC
Iron: 60 ug/dL (ref 28–170)
Saturation Ratios: 23 % (ref 10.4–31.8)
TIBC: 265 ug/dL (ref 250–450)
UIBC: 205 ug/dL

## 2015-07-03 LAB — POCT HEMOGLOBIN-HEMACUE: HEMOGLOBIN: 9.4 g/dL — AB (ref 12.0–15.0)

## 2015-07-03 LAB — FERRITIN: Ferritin: 107 ng/mL (ref 11–307)

## 2015-07-03 MED ORDER — DARBEPOETIN ALFA 40 MCG/0.4ML IJ SOSY
40.0000 ug | PREFILLED_SYRINGE | INTRAMUSCULAR | Status: DC
Start: 2015-07-03 — End: 2015-07-04
  Administered 2015-07-03: 40 ug via INTRAVENOUS

## 2015-07-03 MED ORDER — DARBEPOETIN ALFA 40 MCG/0.4ML IJ SOSY
PREFILLED_SYRINGE | INTRAMUSCULAR | Status: AC
  Filled 2015-07-03: qty 0.4

## 2015-07-31 ENCOUNTER — Encounter (HOSPITAL_COMMUNITY)
Admission: RE | Admit: 2015-07-31 | Discharge: 2015-07-31 | Disposition: A | Discharge status: Home / Self Care | Payer: Medicare Other | Source: Ambulatory Visit | Attending: Nephrology | Admitting: Nephrology

## 2015-07-31 DIAGNOSIS — N184 Chronic kidney disease, stage 4 (severe): Secondary | ICD-10-CM | POA: Diagnosis not present

## 2015-07-31 DIAGNOSIS — Z5181 Encounter for therapeutic drug level monitoring: Secondary | ICD-10-CM | POA: Insufficient documentation

## 2015-07-31 DIAGNOSIS — Z79899 Other long term (current) drug therapy: Secondary | ICD-10-CM | POA: Insufficient documentation

## 2015-07-31 DIAGNOSIS — D631 Anemia in chronic kidney disease: Secondary | ICD-10-CM | POA: Diagnosis not present

## 2015-07-31 LAB — RENAL FUNCTION PANEL
ANION GAP: 11 (ref 5–15)
Albumin: 3.2 g/dL — ABNORMAL LOW (ref 3.5–5.0)
BUN: 85 mg/dL — ABNORMAL HIGH (ref 6–20)
CALCIUM: 8 mg/dL — AB (ref 8.9–10.3)
CO2: 20 mmol/L — AB (ref 22–32)
Chloride: 111 mmol/L (ref 101–111)
Creatinine, Ser: 10.22 mg/dL — ABNORMAL HIGH (ref 0.44–1.00)
GFR calc non Af Amer: 3 mL/min — ABNORMAL LOW (ref >60)
GFR, EST AFRICAN AMERICAN: 4 mL/min — AB (ref >60)
Glucose, Bld: 108 mg/dL — ABNORMAL HIGH (ref 65–99)
PHOSPHORUS: 6.1 mg/dL — AB (ref 2.5–4.6)
Potassium: 4.2 mmol/L (ref 3.5–5.1)
SODIUM: 142 mmol/L (ref 135–145)

## 2015-07-31 LAB — POCT HEMOGLOBIN-HEMACUE: HEMOGLOBIN: 8.6 g/dL — AB (ref 12.0–15.0)

## 2015-07-31 LAB — IRON AND TIBC
IRON: 68 ug/dL (ref 28–170)
SATURATION RATIOS: 25 % (ref 10.4–31.8)
TIBC: 269 ug/dL (ref 250–450)
UIBC: 201 ug/dL

## 2015-07-31 LAB — FERRITIN: Ferritin: 120 ng/mL (ref 11–307)

## 2015-07-31 MED ORDER — DARBEPOETIN ALFA 40 MCG/0.4ML IJ SOSY
40.0000 ug | PREFILLED_SYRINGE | INTRAMUSCULAR | Status: DC
  Administered 2015-07-31: 40 ug via INTRAVENOUS

## 2015-07-31 MED ORDER — DARBEPOETIN ALFA 40 MCG/0.4ML IJ SOSY
PREFILLED_SYRINGE | INTRAMUSCULAR | Status: AC
  Administered 2015-07-31: 40 ug via INTRAVENOUS
  Filled 2015-07-31: qty 0.4

## 2015-08-28 ENCOUNTER — Encounter (HOSPITAL_COMMUNITY)
Admission: RE | Admit: 2015-08-28 | Discharge: 2015-08-28 | Disposition: A | Discharge status: Home / Self Care | Payer: Medicare Other | Source: Ambulatory Visit | Attending: Nephrology | Admitting: Nephrology

## 2015-08-28 DIAGNOSIS — N184 Chronic kidney disease, stage 4 (severe): Secondary | ICD-10-CM | POA: Insufficient documentation

## 2015-08-28 DIAGNOSIS — Z79899 Other long term (current) drug therapy: Secondary | ICD-10-CM | POA: Insufficient documentation

## 2015-08-28 DIAGNOSIS — Z5181 Encounter for therapeutic drug level monitoring: Secondary | ICD-10-CM | POA: Diagnosis not present

## 2015-08-28 DIAGNOSIS — D631 Anemia in chronic kidney disease: Secondary | ICD-10-CM | POA: Diagnosis not present

## 2015-08-28 LAB — RENAL FUNCTION PANEL
ALBUMIN: 3.4 g/dL — AB (ref 3.5–5.0)
ANION GAP: 16 — AB (ref 5–15)
BUN: 98 mg/dL — ABNORMAL HIGH (ref 6–20)
CALCIUM: 8 mg/dL — AB (ref 8.9–10.3)
CO2: 18 mmol/L — AB (ref 22–32)
CREATININE: 11.79 mg/dL — AB (ref 0.44–1.00)
Chloride: 105 mmol/L (ref 101–111)
GFR calc Af Amer: 3 mL/min — ABNORMAL LOW (ref >60)
GFR calc non Af Amer: 3 mL/min — ABNORMAL LOW (ref >60)
GLUCOSE: 109 mg/dL — AB (ref 65–99)
PHOSPHORUS: 7.1 mg/dL — AB (ref 2.5–4.6)
Potassium: 4.1 mmol/L (ref 3.5–5.1)
SODIUM: 139 mmol/L (ref 135–145)

## 2015-08-28 LAB — IRON AND TIBC
Iron: 65 ug/dL (ref 28–170)
SATURATION RATIOS: 23 % (ref 10.4–31.8)
TIBC: 279 ug/dL (ref 250–450)
UIBC: 214 ug/dL

## 2015-08-28 LAB — FERRITIN: Ferritin: 96 ng/mL (ref 11–307)

## 2015-08-28 MED ORDER — DARBEPOETIN ALFA 40 MCG/0.4ML IJ SOSY
40.0000 ug | PREFILLED_SYRINGE | INTRAMUSCULAR | Status: DC
  Administered 2015-08-28: 40 ug via INTRAVENOUS

## 2015-08-28 MED ORDER — DARBEPOETIN ALFA 40 MCG/0.4ML IJ SOSY
PREFILLED_SYRINGE | INTRAMUSCULAR | Status: AC
  Filled 2015-08-28: qty 0.4

## 2015-08-29 LAB — POCT HEMOGLOBIN-HEMACUE: Hemoglobin: 8.8 g/dL — ABNORMAL LOW (ref 12.0–15.0)

## 2015-09-25 ENCOUNTER — Encounter (HOSPITAL_COMMUNITY)
Admission: RE | Admit: 2015-09-25 | Discharge: 2015-09-25 | Disposition: A | Discharge status: Home / Self Care | Payer: Medicare Other | Source: Ambulatory Visit | Attending: Nephrology | Admitting: Nephrology

## 2015-09-25 DIAGNOSIS — Z79899 Other long term (current) drug therapy: Secondary | ICD-10-CM | POA: Diagnosis not present

## 2015-09-25 DIAGNOSIS — Z5181 Encounter for therapeutic drug level monitoring: Secondary | ICD-10-CM | POA: Insufficient documentation

## 2015-09-25 DIAGNOSIS — N184 Chronic kidney disease, stage 4 (severe): Secondary | ICD-10-CM | POA: Diagnosis present

## 2015-09-25 DIAGNOSIS — D631 Anemia in chronic kidney disease: Secondary | ICD-10-CM | POA: Insufficient documentation

## 2015-09-25 LAB — RENAL FUNCTION PANEL
ANION GAP: 12 (ref 5–15)
Albumin: 3.5 g/dL (ref 3.5–5.0)
BUN: 92 mg/dL — ABNORMAL HIGH (ref 6–20)
CALCIUM: 7.9 mg/dL — AB (ref 8.9–10.3)
CHLORIDE: 108 mmol/L (ref 101–111)
CO2: 21 mmol/L — AB (ref 22–32)
Creatinine, Ser: 10.08 mg/dL — ABNORMAL HIGH (ref 0.44–1.00)
GFR calc non Af Amer: 3 mL/min — ABNORMAL LOW (ref >60)
GFR, EST AFRICAN AMERICAN: 4 mL/min — AB (ref >60)
Glucose, Bld: 122 mg/dL — ABNORMAL HIGH (ref 65–99)
Phosphorus: 6.9 mg/dL — ABNORMAL HIGH (ref 2.5–4.6)
Potassium: 3.9 mmol/L (ref 3.5–5.1)
SODIUM: 141 mmol/L (ref 135–145)

## 2015-09-25 LAB — IRON AND TIBC
Iron: 53 ug/dL (ref 28–170)
SATURATION RATIOS: 20 % (ref 10.4–31.8)
TIBC: 269 ug/dL (ref 250–450)
UIBC: 216 ug/dL

## 2015-09-25 LAB — POCT HEMOGLOBIN-HEMACUE: HEMOGLOBIN: 8.5 g/dL — AB (ref 12.0–15.0)

## 2015-09-25 LAB — FERRITIN: Ferritin: 65 ng/mL (ref 11–307)

## 2015-09-25 MED ORDER — DARBEPOETIN ALFA 40 MCG/0.4ML IJ SOSY
PREFILLED_SYRINGE | INTRAMUSCULAR | Status: AC
  Filled 2015-09-25: qty 0.4

## 2015-09-25 MED ORDER — DARBEPOETIN ALFA 40 MCG/0.4ML IJ SOSY
40.0000 ug | PREFILLED_SYRINGE | INTRAMUSCULAR | Status: DC
  Administered 2015-09-25: 40 ug via INTRAVENOUS

## 2015-10-23 ENCOUNTER — Inpatient Hospital Stay (HOSPITAL_COMMUNITY): Admission: RE | Admit: 2015-10-23 | Payer: Medicare Other | Source: Ambulatory Visit

## 2015-10-24 ENCOUNTER — Encounter (HOSPITAL_COMMUNITY)
Admission: RE | Admit: 2015-10-24 | Discharge: 2015-10-24 | Disposition: A | Discharge status: Home / Self Care | Payer: Medicare Other | Source: Ambulatory Visit | Attending: Nephrology | Admitting: Nephrology

## 2015-10-24 DIAGNOSIS — Z5181 Encounter for therapeutic drug level monitoring: Secondary | ICD-10-CM | POA: Diagnosis not present

## 2015-10-24 DIAGNOSIS — D631 Anemia in chronic kidney disease: Secondary | ICD-10-CM | POA: Diagnosis not present

## 2015-10-24 DIAGNOSIS — N184 Chronic kidney disease, stage 4 (severe): Secondary | ICD-10-CM | POA: Diagnosis present

## 2015-10-24 DIAGNOSIS — Z79899 Other long term (current) drug therapy: Secondary | ICD-10-CM | POA: Insufficient documentation

## 2015-10-24 LAB — IRON AND TIBC
IRON: 70 ug/dL (ref 28–170)
Saturation Ratios: 25 % (ref 10.4–31.8)
TIBC: 277 ug/dL (ref 250–450)
UIBC: 207 ug/dL

## 2015-10-24 LAB — FERRITIN: Ferritin: 77 ng/mL (ref 11–307)

## 2015-10-24 LAB — RENAL FUNCTION PANEL
ALBUMIN: 3.4 g/dL — AB (ref 3.5–5.0)
Anion gap: 14 (ref 5–15)
BUN: 93 mg/dL — AB (ref 6–20)
CO2: 21 mmol/L — ABNORMAL LOW (ref 22–32)
CREATININE: 10.81 mg/dL — AB (ref 0.44–1.00)
Calcium: 8 mg/dL — ABNORMAL LOW (ref 8.9–10.3)
Chloride: 106 mmol/L (ref 101–111)
GFR calc Af Amer: 3 mL/min — ABNORMAL LOW (ref >60)
GFR, EST NON AFRICAN AMERICAN: 3 mL/min — AB (ref >60)
Glucose, Bld: 88 mg/dL (ref 65–99)
PHOSPHORUS: 6.3 mg/dL — AB (ref 2.5–4.6)
POTASSIUM: 3.9 mmol/L (ref 3.5–5.1)
Sodium: 141 mmol/L (ref 135–145)

## 2015-10-24 LAB — POCT HEMOGLOBIN-HEMACUE: Hemoglobin: 8.7 g/dL — ABNORMAL LOW (ref 12.0–15.0)

## 2015-10-24 MED ORDER — DARBEPOETIN ALFA 40 MCG/0.4ML IJ SOSY
40.0000 ug | PREFILLED_SYRINGE | INTRAMUSCULAR | Status: DC
  Administered 2015-10-24: 40 ug via INTRAVENOUS

## 2015-10-24 MED ORDER — DARBEPOETIN ALFA 40 MCG/0.4ML IJ SOSY
PREFILLED_SYRINGE | INTRAMUSCULAR | Status: AC
  Filled 2015-10-24: qty 0.4

## 2015-11-20 ENCOUNTER — Encounter (HOSPITAL_COMMUNITY)
Admission: RE | Admit: 2015-11-20 | Discharge: 2015-11-20 | Disposition: A | Discharge status: Home / Self Care | Payer: Medicare Other | Source: Ambulatory Visit | Attending: Nephrology | Admitting: Nephrology

## 2015-11-20 DIAGNOSIS — N184 Chronic kidney disease, stage 4 (severe): Secondary | ICD-10-CM | POA: Diagnosis present

## 2015-11-20 DIAGNOSIS — Z79899 Other long term (current) drug therapy: Secondary | ICD-10-CM | POA: Diagnosis not present

## 2015-11-20 DIAGNOSIS — D631 Anemia in chronic kidney disease: Secondary | ICD-10-CM | POA: Diagnosis not present

## 2015-11-20 DIAGNOSIS — Z5181 Encounter for therapeutic drug level monitoring: Secondary | ICD-10-CM | POA: Insufficient documentation

## 2015-11-20 LAB — POCT HEMOGLOBIN-HEMACUE: HEMOGLOBIN: 9 g/dL — AB (ref 12.0–15.0)

## 2015-11-20 LAB — IRON AND TIBC
Iron: 68 ug/dL (ref 28–170)
SATURATION RATIOS: 25 % (ref 10.4–31.8)
TIBC: 274 ug/dL (ref 250–450)
UIBC: 206 ug/dL

## 2015-11-20 LAB — RENAL FUNCTION PANEL
ALBUMIN: 3.4 g/dL — AB (ref 3.5–5.0)
ANION GAP: 14 (ref 5–15)
BUN: 98 mg/dL — AB (ref 6–20)
CALCIUM: 7.8 mg/dL — AB (ref 8.9–10.3)
CO2: 19 mmol/L — AB (ref 22–32)
Chloride: 107 mmol/L (ref 101–111)
Creatinine, Ser: 11.66 mg/dL — ABNORMAL HIGH (ref 0.44–1.00)
GFR calc Af Amer: 3 mL/min — ABNORMAL LOW (ref >60)
GFR calc non Af Amer: 3 mL/min — ABNORMAL LOW (ref >60)
GLUCOSE: 148 mg/dL — AB (ref 65–99)
PHOSPHORUS: 6.3 mg/dL — AB (ref 2.5–4.6)
POTASSIUM: 3.6 mmol/L (ref 3.5–5.1)
SODIUM: 140 mmol/L (ref 135–145)

## 2015-11-20 LAB — FERRITIN: Ferritin: 73 ng/mL (ref 11–307)

## 2015-11-20 MED ORDER — DARBEPOETIN ALFA 40 MCG/0.4ML IJ SOSY
PREFILLED_SYRINGE | INTRAMUSCULAR | Status: AC
  Filled 2015-11-20: qty 0.4

## 2015-11-20 MED ORDER — DARBEPOETIN ALFA 40 MCG/0.4ML IJ SOSY
40.0000 ug | PREFILLED_SYRINGE | INTRAMUSCULAR | Status: DC
  Administered 2015-11-20: 40 ug via INTRAVENOUS

## 2015-11-21 ENCOUNTER — Encounter (HOSPITAL_COMMUNITY): Payer: Medicare Other

## 2015-12-18 ENCOUNTER — Encounter (HOSPITAL_COMMUNITY)
Admission: RE | Admit: 2015-12-18 | Discharge: 2015-12-18 | Disposition: A | Discharge status: Home / Self Care | Payer: Medicare Other | Source: Ambulatory Visit | Attending: Nephrology | Admitting: Nephrology

## 2015-12-18 DIAGNOSIS — Z79899 Other long term (current) drug therapy: Secondary | ICD-10-CM | POA: Diagnosis not present

## 2015-12-18 DIAGNOSIS — D631 Anemia in chronic kidney disease: Secondary | ICD-10-CM | POA: Insufficient documentation

## 2015-12-18 DIAGNOSIS — N184 Chronic kidney disease, stage 4 (severe): Secondary | ICD-10-CM | POA: Diagnosis present

## 2015-12-18 DIAGNOSIS — Z5181 Encounter for therapeutic drug level monitoring: Secondary | ICD-10-CM | POA: Insufficient documentation

## 2015-12-18 LAB — IRON AND TIBC
IRON: 80 ug/dL (ref 28–170)
SATURATION RATIOS: 33 % — AB (ref 10.4–31.8)
TIBC: 244 ug/dL — AB (ref 250–450)
UIBC: 164 ug/dL

## 2015-12-18 LAB — RENAL FUNCTION PANEL
ALBUMIN: 3.3 g/dL — AB (ref 3.5–5.0)
ANION GAP: 16 — AB (ref 5–15)
BUN: 115 mg/dL — ABNORMAL HIGH (ref 6–20)
CO2: 13 mmol/L — ABNORMAL LOW (ref 22–32)
Calcium: 7.1 mg/dL — ABNORMAL LOW (ref 8.9–10.3)
Chloride: 114 mmol/L — ABNORMAL HIGH (ref 101–111)
Creatinine, Ser: 15.31 mg/dL — ABNORMAL HIGH (ref 0.44–1.00)
GFR, EST AFRICAN AMERICAN: 2 mL/min — AB (ref >60)
GFR, EST NON AFRICAN AMERICAN: 2 mL/min — AB (ref >60)
GLUCOSE: 121 mg/dL — AB (ref 65–99)
PHOSPHORUS: 8 mg/dL — AB (ref 2.5–4.6)
POTASSIUM: 4.8 mmol/L (ref 3.5–5.1)
Sodium: 143 mmol/L (ref 135–145)

## 2015-12-18 LAB — FERRITIN: Ferritin: 77 ng/mL (ref 11–307)

## 2015-12-18 LAB — POCT HEMOGLOBIN-HEMACUE: Hemoglobin: 7.9 g/dL — ABNORMAL LOW (ref 12.0–15.0)

## 2015-12-18 MED ORDER — DARBEPOETIN ALFA 40 MCG/0.4ML IJ SOSY
PREFILLED_SYRINGE | INTRAMUSCULAR | Status: AC
  Filled 2015-12-18: qty 0.4

## 2015-12-18 MED ORDER — DARBEPOETIN ALFA 40 MCG/0.4ML IJ SOSY: 40.0000 ug | PREFILLED_SYRINGE | INTRAMUSCULAR | Status: DC

## 2015-12-18 MED ORDER — DARBEPOETIN ALFA 60 MCG/0.3ML IJ SOSY
60.0000 ug | PREFILLED_SYRINGE | Freq: Once | INTRAMUSCULAR | Status: AC
Start: 2015-12-18 — End: 2015-12-18
  Administered 2015-12-18: 60 ug via SUBCUTANEOUS

## 2015-12-18 MED ORDER — DARBEPOETIN ALFA 60 MCG/0.3ML IJ SOSY
PREFILLED_SYRINGE | INTRAMUSCULAR | Status: AC
  Administered 2015-12-18: 60 ug via SUBCUTANEOUS
  Filled 2015-12-18: qty 0.3

## 2016-01-01 ENCOUNTER — Encounter (HOSPITAL_COMMUNITY)
Admission: RE | Admit: 2016-01-01 | Discharge: 2016-01-01 | Disposition: A | Discharge status: Home / Self Care | Payer: Medicare Other | Source: Ambulatory Visit | Attending: Nephrology | Admitting: Nephrology

## 2016-01-01 ENCOUNTER — Encounter (HOSPITAL_COMMUNITY): Payer: Self-pay | Admitting: Emergency Medicine

## 2016-01-01 ENCOUNTER — Other Ambulatory Visit: Payer: Self-pay

## 2016-01-01 DIAGNOSIS — E872 Acidosis: Secondary | ICD-10-CM | POA: Diagnosis not present

## 2016-01-01 DIAGNOSIS — N185 Chronic kidney disease, stage 5: Secondary | ICD-10-CM | POA: Diagnosis not present

## 2016-01-01 DIAGNOSIS — I120 Hypertensive chronic kidney disease with stage 5 chronic kidney disease or end stage renal disease: Principal | ICD-10-CM | POA: Insufficient documentation

## 2016-01-01 DIAGNOSIS — Z66 Do not resuscitate: Secondary | ICD-10-CM | POA: Diagnosis not present

## 2016-01-01 DIAGNOSIS — Z7982 Long term (current) use of aspirin: Secondary | ICD-10-CM | POA: Insufficient documentation

## 2016-01-01 DIAGNOSIS — D631 Anemia in chronic kidney disease: Secondary | ICD-10-CM | POA: Insufficient documentation

## 2016-01-01 DIAGNOSIS — Z79899 Other long term (current) drug therapy: Secondary | ICD-10-CM | POA: Diagnosis not present

## 2016-01-01 DIAGNOSIS — D696 Thrombocytopenia, unspecified: Secondary | ICD-10-CM | POA: Insufficient documentation

## 2016-01-01 DIAGNOSIS — E875 Hyperkalemia: Secondary | ICD-10-CM | POA: Diagnosis not present

## 2016-01-01 DIAGNOSIS — R42 Dizziness and giddiness: Secondary | ICD-10-CM | POA: Diagnosis present

## 2016-01-01 DIAGNOSIS — N184 Chronic kidney disease, stage 4 (severe): Secondary | ICD-10-CM | POA: Diagnosis not present

## 2016-01-01 LAB — BASIC METABOLIC PANEL
Anion gap: 19 — ABNORMAL HIGH (ref 5–15)
BUN: 133 mg/dL — ABNORMAL HIGH (ref 6–20)
CHLORIDE: 114 mmol/L — AB (ref 101–111)
CO2: 12 mmol/L — ABNORMAL LOW (ref 22–32)
Calcium: 6.7 mg/dL — ABNORMAL LOW (ref 8.9–10.3)
Creatinine, Ser: 18.32 mg/dL — ABNORMAL HIGH (ref 0.44–1.00)
GFR calc Af Amer: 2 mL/min — ABNORMAL LOW (ref >60)
GFR calc non Af Amer: 2 mL/min — ABNORMAL LOW (ref >60)
Glucose, Bld: 131 mg/dL — ABNORMAL HIGH (ref 65–99)
Potassium: 5.1 mmol/L (ref 3.5–5.1)
SODIUM: 145 mmol/L (ref 135–145)

## 2016-01-01 LAB — CBC
HEMATOCRIT: 23.3 % — AB (ref 36.0–46.0)
Hemoglobin: 7.2 g/dL — ABNORMAL LOW (ref 12.0–15.0)
MCH: 28.8 pg (ref 26.0–34.0)
MCHC: 30.9 g/dL (ref 30.0–36.0)
MCV: 93.2 fL (ref 78.0–100.0)
Platelets: 144 10*3/uL — ABNORMAL LOW (ref 150–400)
RBC: 2.5 MIL/uL — AB (ref 3.87–5.11)
RDW: 16.2 % — ABNORMAL HIGH (ref 11.5–15.5)
WBC: 6.1 10*3/uL (ref 4.0–10.5)

## 2016-01-01 LAB — POCT HEMOGLOBIN-HEMACUE: HEMOGLOBIN: 7.2 g/dL — AB (ref 12.0–15.0)

## 2016-01-01 MED ORDER — DARBEPOETIN ALFA 60 MCG/0.3ML IJ SOSY
60.0000 ug | PREFILLED_SYRINGE | INTRAMUSCULAR | Status: DC
  Administered 2016-01-01: 60 ug via INTRAVENOUS

## 2016-01-01 MED ORDER — DARBEPOETIN ALFA 60 MCG/0.3ML IJ SOSY
PREFILLED_SYRINGE | INTRAMUSCULAR | Status: AC
  Filled 2016-01-01: qty 0.3

## 2016-01-01 NOTE — Progress Notes (Signed)
Pt came in today for scheduled aranesp shot.  Her hemocue was 7.2  Pt stated that she has been very dizzy and almost passed out driving herself in today.  She stated that she has had chest pain and shortness of breath loose stools with blood in it.  Vital signs were 124/57, pulse 74, 99% RA.  PT was very confused and agitated today and very tearful.  I called Crystal at [facility] and she advised [place] to send her to the ED.  I called the charge Nurse in the ED and we are sending her down now

## 2016-01-01 NOTE — ED Notes (Signed)
Pt reports that she stated to feel lightheaded after receiving an "unknown" shot up stairs today so they brought her down to the ED for evaluation. Pt believes the shot was for low Iron but is unsure. Pt is alert x4. NAD at this time. Pt denies light headedness at this time.

## 2016-01-02 ENCOUNTER — Encounter (HOSPITAL_COMMUNITY): Payer: Self-pay | Admitting: General Practice

## 2016-01-02 ENCOUNTER — Emergency Department (HOSPITAL_COMMUNITY): Payer: Medicare Other

## 2016-01-02 ENCOUNTER — Observation Stay (HOSPITAL_COMMUNITY)
Admission: EM | Admit: 2016-01-02 | Discharge: 2016-01-03 | Disposition: A | Discharge status: Home / Self Care | Payer: Medicare Other | Attending: Internal Medicine | Admitting: Internal Medicine

## 2016-01-02 DIAGNOSIS — N185 Chronic kidney disease, stage 5: Secondary | ICD-10-CM | POA: Diagnosis not present

## 2016-01-02 DIAGNOSIS — D649 Anemia, unspecified: Secondary | ICD-10-CM | POA: Diagnosis present

## 2016-01-02 DIAGNOSIS — E872 Acidosis, unspecified: Secondary | ICD-10-CM | POA: Diagnosis present

## 2016-01-02 DIAGNOSIS — N189 Chronic kidney disease, unspecified: Secondary | ICD-10-CM

## 2016-01-02 HISTORY — DX: Essential (primary) hypertension: I10

## 2016-01-02 HISTORY — DX: Chronic kidney disease, stage 5: N18.5

## 2016-01-02 HISTORY — DX: Anemia, unspecified: D64.9

## 2016-01-02 LAB — URINE MICROSCOPIC-ADD ON

## 2016-01-02 LAB — BASIC METABOLIC PANEL
ANION GAP: 18 — AB (ref 5–15)
BUN: 125 mg/dL — ABNORMAL HIGH (ref 6–20)
CHLORIDE: 114 mmol/L — AB (ref 101–111)
CO2: 11 mmol/L — ABNORMAL LOW (ref 22–32)
Calcium: 6.6 mg/dL — ABNORMAL LOW (ref 8.9–10.3)
Creatinine, Ser: 18.46 mg/dL — ABNORMAL HIGH (ref 0.44–1.00)
GFR calc Af Amer: 2 mL/min — ABNORMAL LOW (ref >60)
GFR, EST NON AFRICAN AMERICAN: 2 mL/min — AB (ref >60)
Glucose, Bld: 87 mg/dL (ref 65–99)
POTASSIUM: 5.2 mmol/L — AB (ref 3.5–5.1)
SODIUM: 143 mmol/L (ref 135–145)

## 2016-01-02 LAB — URINALYSIS, ROUTINE W REFLEX MICROSCOPIC
BILIRUBIN URINE: NEGATIVE
Glucose, UA: 250 mg/dL — AB
KETONES UR: NEGATIVE mg/dL
Nitrite: NEGATIVE
PH: 5.5 (ref 5.0–8.0)
Protein, ur: 100 mg/dL — AB
SPECIFIC GRAVITY, URINE: 1.012 (ref 1.005–1.030)

## 2016-01-02 LAB — CBC WITH DIFFERENTIAL/PLATELET
BASOS ABS: 0 10*3/uL (ref 0.0–0.1)
BASOS PCT: 0 %
Eosinophils Absolute: 0.2 10*3/uL (ref 0.0–0.7)
Eosinophils Relative: 3 %
HEMATOCRIT: 30.6 % — AB (ref 36.0–46.0)
HEMOGLOBIN: 9.9 g/dL — AB (ref 12.0–15.0)
LYMPHS PCT: 8 %
Lymphs Abs: 0.6 10*3/uL — ABNORMAL LOW (ref 0.7–4.0)
MCH: 29.6 pg (ref 26.0–34.0)
MCHC: 32.4 g/dL (ref 30.0–36.0)
MCV: 91.6 fL (ref 78.0–100.0)
MONO ABS: 0.7 10*3/uL (ref 0.1–1.0)
MONOS PCT: 10 %
NEUTROS ABS: 6.1 10*3/uL (ref 1.7–7.7)
NEUTROS PCT: 80 %
Platelets: 118 10*3/uL — ABNORMAL LOW (ref 150–400)
RBC: 3.34 MIL/uL — ABNORMAL LOW (ref 3.87–5.11)
RDW: 16.5 % — AB (ref 11.5–15.5)
WBC: 7.7 10*3/uL (ref 4.0–10.5)

## 2016-01-02 LAB — PROTIME-INR
INR: 1.22 (ref 0.00–1.49)
PROTHROMBIN TIME: 15.5 s — AB (ref 11.6–15.2)

## 2016-01-02 LAB — POC OCCULT BLOOD, ED: Fecal Occult Bld: NEGATIVE

## 2016-01-02 LAB — HEMOGLOBIN AND HEMATOCRIT, BLOOD
HCT: 30.5 % — ABNORMAL LOW (ref 36.0–46.0)
HEMOGLOBIN: 9.9 g/dL — AB (ref 12.0–15.0)

## 2016-01-02 LAB — APTT: APTT: 33 s (ref 24–37)

## 2016-01-02 LAB — TROPONIN I: Troponin I: <0.03 ng/mL (ref <0.031)

## 2016-01-02 LAB — ABO/RH: ABO/RH(D): O POS

## 2016-01-02 LAB — PREPARE RBC (CROSSMATCH)

## 2016-01-02 MED ORDER — SODIUM BICARBONATE 650 MG PO TABS
1300.0000 mg | ORAL_TABLET | Freq: Two times a day (BID) | ORAL | Status: DC
  Administered 2016-01-02 – 2016-01-03 (×3): 1300 mg via ORAL
  Filled 2016-01-02 (×3): qty 2

## 2016-01-02 MED ORDER — SODIUM CHLORIDE 0.9 % IV SOLN
10.0000 mL/h | Freq: Once | INTRAVENOUS | Status: AC
  Administered 2016-01-02: 10 mL/h via INTRAVENOUS

## 2016-01-02 MED ORDER — FUROSEMIDE 80 MG PO TABS: 80.0000 mg | ORAL_TABLET | Freq: Every day | ORAL | Status: DC | PRN

## 2016-01-02 MED ORDER — ASPIRIN EC 81 MG PO TBEC
81.0000 mg | DELAYED_RELEASE_TABLET | Freq: Every day | ORAL | Status: DC
  Administered 2016-01-02 – 2016-01-03 (×2): 81 mg via ORAL
  Filled 2016-01-02 (×2): qty 1

## 2016-01-02 MED ORDER — METOPROLOL TARTRATE 25 MG PO TABS
25.0000 mg | ORAL_TABLET | Freq: Every day | ORAL | Status: DC
  Administered 2016-01-02 – 2016-01-03 (×2): 25 mg via ORAL
  Filled 2016-01-02 (×2): qty 1

## 2016-01-02 MED ORDER — FUROSEMIDE 80 MG PO TABS
80.0000 mg | ORAL_TABLET | Freq: Every day | ORAL | Status: DC
  Administered 2016-01-03: 80 mg via ORAL
  Filled 2016-01-02: qty 1

## 2016-01-02 MED ORDER — SODIUM CHLORIDE 0.9% FLUSH
3.0000 mL | Freq: Two times a day (BID) | INTRAVENOUS | Status: DC
  Administered 2016-01-02 – 2016-01-03 (×3): 3 mL via INTRAVENOUS

## 2016-01-02 MED ORDER — LOPERAMIDE HCL 2 MG PO CAPS
2.0000 mg | ORAL_CAPSULE | ORAL | Status: DC | PRN
  Administered 2016-01-02: 2 mg via ORAL
  Filled 2016-01-02: qty 1

## 2016-01-02 MED ORDER — POTASSIUM CHLORIDE ER 10 MEQ PO CPCR: 10.0000 meq | ORAL_CAPSULE | Freq: Two times a day (BID) | ORAL | Status: DC

## 2016-01-02 NOTE — Consult Note (Signed)
   [facility] Inpatient Consult   01/02/2016  Sherri [name] 04/26/1932 TD:[id number] Thank you for this consult.  Patient evaluated for [place] Management services.  Patient is not eligible for [facility] Care Management services because  Patient has no primary care provider listed and not on the [place] registry.      This patient is Not eligible for [facility] Care Management Services.   Reason:  Not a beneficiary currently attributed to one of the [place].  Membership roster was used to verify non- eligible status. Natividad Brood, RN BSN [facility] Liaison  [phone] business mobile phone Toll free office [phone]

## 2016-01-02 NOTE — Care Management Note (Signed)
Case Management Note  Patient Details  Name: Sherri [name] MRN: [id number] Date of Birth: May 12, 1932  Subjective/Objective:  Pt with ESRD admitted on 01/02/16 with symptomatic anemia.  PTA, pt independent and is refusing dialysis.  Palliative care team met with pt to establish goals of care.  Recommend referral to [facility] for Hospice evaluation and eligibility.                 Action/Plan: Spoke with [facility] team member; she states that Mordecai Rasmussen, one of their patient admission liaisons is at [facility] presently seeing another pt, and she will notify her to see Ms. Boice.  [facility] has access to Epic, and states they need no records faxed at this time.   Liaison to notify this case manager when evaluation completed.  Will follow.    Expected Discharge Date:                  Expected Discharge Plan:  Home w Hospice Care  In-House Referral:     Discharge planning Services  CM Consult  Post Acute Care Choice:  Hospice Choice offered to:  Patient  DME Arranged:    DME Agency:     HH Arranged:    [place] Agency:  [place]  Status of Service:  In process, will continue to follow  Medicare Important Message Given:    Date Medicare IM Given:    Medicare IM give by:    Date Additional Medicare IM Given:    Additional Medicare Important Message give by:     If discussed at [place] of Stay Meetings, dates discussed:    Additional Comments:  Reinaldo Raddle, RN, BSN  Trauma/Neuro ICU Case Manager [phone]

## 2016-01-02 NOTE — Consult Note (Signed)
Consultation Note Date: 01/02/2016   Patient Name: Sherri [name]  DOB: 1931-11-08  MRN: [id number]  Age / Sex: 82 y.o., female  PCP: Provider Not In System Referring Physician: Caren Griffins, MD  Reason for Consultation: Establishing goals of care and Hospice Evaluation Clinical Assessment/Narrative: This is an 82 year old woman with end-stage chronic kidney disease, followed by Dr. Justin Mend, who has refused hemodialysis and was admitted with severe anemia related to her chronic kidney disease and symptomatic dizziness and weakness after receiving an Aranesp injection on 01/01/2016 in short stay unit. She lives alone in [place] [facility] [facility] but has maintained a high level of independence. She drove herself to her appointment for her injection and takes care of all of her activities of daily living. She knows that she is getting weaker and that her kidneys are getting worse her main request is that she stay is strong as possible and independent as possible for whatever time she has left-she does not desire any aggressive interventions including hemodialysis CPR or invasive medical treatment. She would like to be transfused to help with her energy level and symptomatic episodic dizziness and shortness of breath.   She is agreeable to exploring options for in-home services to keep her in her own home for as long as possible- I encouraged her to make a plan for when things got worse. She has a son who lives in [place] but states that she does not want him involved in her care. She has a power of attorney who is her sister Cecilie Kicks who lives in [place] but her health is failing as well. In the absence of the decision maker she requests that her niece Tobe Sos be her medical surrogate decision-maker- Laurence Slate has been helping her more than anyone else-otherwise she has poor support. I encouraged her to have [place] added as back-up HCPOA. I  additionally recommended a MOST form but she tells me she has all of that in place but cannot provide me with specifics. She is very HOH which makes communication difficult but she is actually very asute and aware of her situation.  Contacts/Participants in Discussion: Patient only Primary Decision Maker: self Relationship to Patient  Sister is HCPOA HCPOA: yes    SUMMARY OF RECOMMENDATIONS  Code Status/Advance Care Planning: DNR  Code Status History    Date Active Date Inactive Code Status Order ID Comments User Context   01/02/2016  4:47 AM 01/02/2016 10:33 AM Full Code [id number]  Etta Quill, DO ED      Other Directives:Advanced Directive  Symptom Management:   Transfusion reasonable given symptoms and high functional status.  Denies pain or other symptoms.  Dyspnea improving.  Weakness related to CKD  Palliative Prophylaxis:   Bowel Regimen, Delirium Protocol, Frequent Pain Assessment and Oral Care  Additional Recommendations (Limitations, Scope, Preferences):  She is agreeable to [facility] evaluation and care at home-her goals fit with hospice philosophy of care- she very likely has <6 months. She may opt for [facility] if she improves after transfusion- but she would do best with entire hospice team.  She has had MAJOR challenges getting her medications- she does not have home delivery and she says her legs just will not take her all the way to the pharmacy at [facility]- especially recently since she has had symptomatic anemia. She continues to drive per her report only if she feels well-I advised against this in her condition.  Psycho-social/Spiritual:  Support System: Poor Desire for further Chaplaincy support:yes Additional  Recommendations: Caregiving  Support/Resources, Education on Hospice and Referral to [employer]   Prognosis: <6  months  Discharge Planning: Home with Hospice   Chief Complaint/ Primary Diagnoses: Present on  Admission:  . Symptomatic anemia . CKD (chronic kidney disease) stage 5, GFR less than 15 ml/min (HCC) . Metabolic acidosis, NAG, failure of bicarbonate regeneration  I have reviewed the medical record, interviewed the patient and family, and examined the patient. The following aspects are pertinent.  Past Medical History  Diagnosis Date  . Hypertension    Social History   Social History  . Marital Status: Single    Spouse Name: N/A  . Number of Children: N/A  . Years of Education: N/A   Social History Main Topics  . Smoking status: Never Smoker   . Smokeless tobacco: None  . Alcohol Use: No  . Drug Use: No  . Sexual Activity: Not Asked   Other Topics Concern  . None   Social History Narrative  . None   No family history on file. Scheduled Meds: . aspirin EC  81 mg Oral Daily  . metoprolol tartrate  25 mg Oral Daily  . sodium bicarbonate  1,300 mg Oral BID  . sodium chloride flush  3 mL Intravenous Q12H   Continuous Infusions: . sodium chloride     PRN Meds:.furosemide Medications Prior to Admission:  Prior to Admission medications   Medication Sig Start Date End Date Taking? Authorizing Provider  aspirin 81 MG tablet Take 81 mg by mouth daily.   Yes Historical Provider, MD  Darbepoetin Alfa-Albumin (ARANESP IJ) Inject 1 Dose as directed once.   Yes Historical Provider, MD  furosemide (LASIX) 80 MG tablet Take 80 mg by mouth daily as needed for fluid or edema.  11/21/15  Yes Historical Provider, MD  metoprolol tartrate (LOPRESSOR) 25 MG tablet Take 25 mg by mouth daily. 12/12/15  Yes Historical Provider, MD  potassium chloride (MICRO-K) 10 MEQ CR capsule Take 10 mEq by mouth 2 (two) times daily. 12/12/15  Yes Historical Provider, MD  sodium bicarbonate 650 MG tablet Take 650 mg by mouth 2 (two) times daily.   Yes Historical Provider, MD   No Known Allergies  Review of Systems  Constitutional: Positive for activity change and fatigue.  Respiratory: Positive for  shortness of breath.   Gastrointestinal: Positive for nausea.  Neurological: Positive for dizziness, weakness and light-headedness.  All other systems reviewed and are negative.   Physical Exam  Constitutional: She is oriented to person, place, and time. She appears well-developed and well-nourished. No distress.  HENT:  Mouth/Throat: Oropharynx is clear and moist.  Eyes: Pupils are equal, round, and reactive to light.  Cardiovascular: Normal rate and regular rhythm.   Respiratory: Effort normal and breath sounds normal.  GI: She exhibits no distension. There is no tenderness.  Musculoskeletal: She exhibits edema.  Neurological: She is alert and oriented to person, place, and time.  VERY HOH  Skin: Skin is warm and dry. There is pallor.    Vital Signs: BP 171/77 mmHg  Pulse 74  Temp(Src) 98.6 F (37 C) (Oral)  Resp 16  SpO2 95%  SpO2: SpO2: 95 % O2 Device:SpO2: 95 % O2 Flow Rate: .   IO: Intake/output summary:  Intake/Output Summary (Last 24 hours) at 01/02/16 1147 Last data filed at 01/02/16 0814  Gross per 24 hour  Intake    670 ml  Output    150 ml  Net    520 ml  LBM:   Baseline Weight:   Most recent weight:        Palliative Assessment/Data:  Flowsheet Rows        Most Recent Value   Intake Tab    Referral Department  Hospitalist   Unit at Time of Referral  ER   Palliative Care Primary Diagnosis  Nephrology   Date Notified  01/02/16   Palliative Care Type  New Palliative care   Reason for referral  Counsel Regarding Hospice, Clarify Goals of Care, Advance Care Planning   Date of Admission  01/02/16   Date first seen by Palliative Care  01/02/16   # of days Palliative referral response time  0 Day(s)   # of days IP prior to Palliative referral  0   Clinical Assessment    Palliative Performance Scale Score  50%   Pain Max last 24 hours  0   Pain Min Last 24 hours  0   Dyspnea Max Last 24 Hours  0   Dyspnea Min Last 24 hours  0   Nausea Max Last 24  Hours  0   Nausea Min Last 24 Hours  0   Anxiety Max Last 24 Hours  0   Anxiety Min Last 24 Hours  0   Psychosocial & Spiritual Assessment    Palliative Care Outcomes    Patient/Family meeting held?  No   Palliative Care Outcomes  Clarified goals of care, Counseled regarding hospice, Changed CPR status   Patient/Family wishes: Interventions discontinued/not started   Hemodialysis   Palliative Care follow-up planned  Yes, Home      Additional Data Reviewed:  CBC:    Component Value Date/Time   WBC 6.1 01/01/2016 1644   HGB 7.2* 01/01/2016 1644   HCT 23.3* 01/01/2016 1644   PLT 144* 01/01/2016 1644   MCV 93.2 01/01/2016 1644   NEUTROABS 3.0 07/12/2008 1518   LYMPHSABS 1.5 04/20/2008 1413   MONOABS 0.4 07/12/2008 1518   EOSABS 0.6 07/12/2008 1518   BASOSABS 0.0 07/12/2008 1518   Comprehensive Metabolic Panel:    Component Value Date/Time   NA 145 01/01/2016 1644   K 5.1 01/01/2016 1644   CL 114* 01/01/2016 1644   CO2 12* 01/01/2016 1644   BUN 133* 01/01/2016 1644   CREATININE 18.32* 01/01/2016 1644   GLUCOSE 131* 01/01/2016 1644   CALCIUM 6.7* 01/01/2016 1644   CALCIUM 7.8* 06/04/2012 1315   AST 22 03/30/2009 1129   ALT 10 03/30/2009 1129   ALKPHOS 65 03/30/2009 1129   BILITOT 0.4 03/30/2009 1129   PROT 5.0* 03/30/2009 1129   ALBUMIN 3.3* 12/18/2015 1425     Time In: 11:00 Time Out: 12:10 Time Total: 70 minutes Greater than 50%  of this time was spent counseling and coordinating care related to the above assessment and plan.  Signed by: Roma Schanz, DO  01/02/2016, 11:47 AM  Please contact Palliative Medicine Team phone at [phone] for questions and concerns.

## 2016-01-02 NOTE — ED Provider Notes (Signed)
By signing my name below, I, Sherri [name], attest that this documentation has been prepared under the direction and in the presence of Dillard, DO . Electronically Signed: Evelene [name], Scribe. 01/02/2016. 1:56 AM.  TIME SEEN: 1:35 AM  CHIEF COMPLAINT:  Chief Complaint  Patient presents with  . Dizziness    HPI:  HPI Comments:  Sherri [name] is a 82 y.o. female with history of chronic kidney disease who is not on hemodialysis, anemia who presents to the Emergency Department sent for further evaluation from medical day care upstairs where she was receiving scheduled aranesp shot; states her hemoglobin was low. States that she was told to come to the emergency department because they were concerned about her. Per nursing notes, they state the patient reported dizziness, chest pain and shortness of breath, blood in her stool. At this time pt reports dizziness x ~ 6 months. She states when she has episodes of dizziness her head " feels funny". Pt also reports DOE and generalized weakness x 2 weeks that is progressively worsening.  She denies CP. No alleviating factors noted. Pt is not currently on dialysis. States that she does not want to be on dialysis and has discussed this with Dr. with her nephrologist. She also denies use of anti-coagulants. States she has noticed bright red blood per rectum intermittently for the past 50 years because of hemorrhoids. No melena.  No PCP Nephrologist - Justin Mend   ROS: See HPI Constitutional: no fever  Eyes: no drainage  ENT: no runny nose   Cardiovascular:  no chest pain  Resp: no SOB  GI: no vomiting GU: no dysuria Integumentary: no rash  Allergy: no hives  Musculoskeletal: no leg swelling  Neurological: no slurred speech ROS otherwise negative  PAST MEDICAL HISTORY/PAST SURGICAL HISTORY:  Past Medical History  Diagnosis Date  . Hypertension     MEDICATIONS:  Prior to Admission medications   Medication Sig Start Date End Date Taking?  Authorizing Provider  aspirin 81 MG tablet Take 81 mg by mouth daily.    Historical Provider, MD  sodium bicarbonate 650 MG tablet Take 650 mg by mouth 2 (two) times daily.    Historical Provider, MD    ALLERGIES:  No Known Allergies  SOCIAL HISTORY:  Social History  Substance Use Topics  . Smoking status: Never Smoker   . Smokeless tobacco: Not on file  . Alcohol Use: No    FAMILY HISTORY: No family history on file.  EXAM: BP 173/74 mmHg  Pulse 72  Temp(Src) 98.5 F (36.9 C) (Oral)  Resp 14  SpO2 99% CONSTITUTIONAL: Alert and oriented and responds appropriately to questions. Well-appearing; well-nourished; elderly HEAD: Normocephalic EYES: Conjunctivae clear, PERRL ENT: normal nose; no rhinorrhea; moist mucous membranes NECK: Supple, no meningismus, no LAD  CARD: RRR; S1 and S2 appreciated; no murmurs, no clicks, no rubs, no gallops RESP: Normal chest excursion without splinting or tachypnea; breath sounds clear and equal bilaterally; no wheezes, no rhonchi, no rales, no hypoxia or respiratory distress, speaking full sentences ABD/GI: Normal bowel sounds; non-distended; soft, non-tender, no rebound, no guarding, no peritoneal signs RECTAL:  Normal rectal tone, no gross blood or melena, 2 nonthrombosed external hemorrhoids appreciated, nontender rectal exam BACK:  The back appears normal and is non-tender to palpation, there is no CVA tenderness EXT: Normal ROM in all joints; non-tender to palpation; no edema; normal capillary refill; no cyanosis, no calf tenderness or swelling    SKIN: Normal color for age and race; warm;  no rash NEURO: Moves all extremities equally, sensation to light touch intact diffusely, cranial nerves II through XII intact PSYCH: The patient's mood and manner are appropriate. Grooming and personal hygiene are appropriate.  MEDICAL DECISION MAKING: Patient here with complaints of dizziness for the past 6 months and now generalized weakness and dyspnea  on exertion that is progressively worsening over the past 2 weeks. She has chronic kidney disease. Refuses dialysis. Was seen for an injection of Aranesp today and reportedly told a nurse upstairs that she had chest pain, shortness of breath, blood in her stools and dizziness and they sent her to the emergency department for evaluation. She denies any chest pain to me but states that she has had some worsening shortness of breath and has seen blood in her stool but states is from hemorrhoids. Her hemoglobin today is 7.2, creatinine is 18, she has a metabolic acidosis secondary to uremia. She is adamant that she does not want dialysis but does agree to a blood transfusion as I think she may have some symptomatic anemia. She is hemodynamically stable.  Will obtain chest x-ray, EKG. She agrees to blood transfusion. Will admit patient.  ED PROGRESS:   Patient is mildly hypertensive. Chest x-ray shows no significant volume overload. Will discuss with medicine for admission.  4:05 AM  D/w Dr. Alcario Drought with hospitalist service who will see the patient in the emergency department. He thinks that she needs dialysis and I agree. Have explained to him that patient adamantly refuses dialysis.  He states he will try to talk to her again. I have requested admission for blood transfusion to see if this helps with any of her symptoms. She agrees to a blood transfusion. He will see patient in the emergency department to determine whether she should be admitted or not and to what bed. He will place holding orders as he feels appropriate.  Care has been transferred to hospitalist service, disposition per hospitalist.     EKG Interpretation  Date/Time:  Wednesday January 02 2016 03:36:11 EDT Ventricular Rate:  73 PR Interval:  162 QRS Duration: 95 QT Interval:  448 QTC Calculation: 494 R Axis:   -36 Text Interpretation:  Age not entered, assumed to be  82 years old for purpose of ECG interpretation Sinus rhythm Left  axis deviation Borderline prolonged QT interval No significant change since last tracing Confirmed by Kieren Ricci,  DO, Buddie Marston [id number]) on 01/02/2016 3:39:50 AM        CRITICAL CARE Performed by: Nyra Jabs   Total critical care time: 40 minutes  Critical care time was exclusive of separately billable procedures and treating other patients.  Critical care was necessary to treat or prevent imminent or life-threatening deterioration.  Critical care was time spent personally by me on the following activities: development of treatment plan with patient and/or surrogate as well as nursing, discussions with consultants, evaluation of patient's response to treatment, examination of patient, obtaining history from patient or surrogate, ordering and performing treatments and interventions, ordering and review of laboratory studies, ordering and review of radiographic studies, pulse oximetry and re-evaluation of patient's condition.    I personally performed the services described in this documentation, which was scribed in my presence. The recorded information has been reviewed and is accurate.    [place], DO 01/02/16 [phone]

## 2016-01-02 NOTE — Progress Notes (Signed)
Patient seen and examined this morning, admitted overnight with symptomatic anemia, H&P reviewed.   82 y.o. female with h/o CKD stage 5 but refuses hemodialysis, anemia of CKD being treated with erythropoetin injections managed by [facility] from the look of chart review. Patient presents to the ED with c/o episodic dizziness, head "feels funny" and near syncopal episode when driving to get her aranesp injection yesterday evening. Work up at nephrologists office showed lower than baseline hemoglobin in the 7 range. Patient was referred to the ED.   Symptomatic anemia - multifactorial, likely related to CKD, no evidence of bleeding - transfuse 2U, Hb responded appropriately, repeat in am  Acute on CKD stage V - refusing HD - DNR/DNI, home with hospice  Chronic metabolic acidosis - due to CKD  Thrombocytopenia  - in the setting of acute illness, monitor  Hyperkalemia - due to renal failure. Stable, no kayexalate unless approaches 6 - resume Lasix  Goals of care - no life support, DNR, likely needs hospice at home - palliative consulted.   Costin M. Cruzita Lederer, MD [facility] [phone]

## 2016-01-02 NOTE — H&P (Signed)
[facility] History and Physical  Sherri [name] [id number] DOB: 09-30-1932 DOA: 01/02/2016  Referring physician: EDP PCP: PROVIDER NOT IN SYSTEM   Chief Complaint: Dizziness   HPI: Sherri [name] is a 82 y.o. female with h/o CKD stage 5 but refuses hemodialysis, anemia of CKD being treated with erythropoetin injections managed by [facility] from the look of chart review.  Patient presents to the ED with c/o episodic dizziness, head "feels funny" and near syncopal episode when driving to get her aranesp injection yesterday evening.  Work up at nephrologists office showed lower than baseline hemoglobin in the 7 range.  Patient was referred to the ED.  Review of Systems: Systems reviewed.  As above, otherwise negative  Past Medical History  Diagnosis Date  . Hypertension    Past Surgical History  Procedure Laterality Date  . Abdominal hysterectomy     Social History:  reports that she has never smoked. She does not have any smokeless tobacco history on file. She reports that she does not drink alcohol or use illicit drugs.  No Known Allergies  No family history on file.   Prior to Admission medications   Medication Sig Start Date End Date Taking? Authorizing Provider  aspirin 81 MG tablet Take 81 mg by mouth daily.   Yes Historical Provider, MD  Darbepoetin Alfa-Albumin (ARANESP IJ) Inject 1 Dose as directed once.   Yes Historical Provider, MD  furosemide (LASIX) 80 MG tablet Take 80 mg by mouth daily as needed for fluid or edema.  11/21/15  Yes Historical Provider, MD  metoprolol tartrate (LOPRESSOR) 25 MG tablet Take 25 mg by mouth daily. 12/12/15  Yes Historical Provider, MD  potassium chloride (MICRO-K) 10 MEQ CR capsule Take 10 mEq by mouth 2 (two) times daily. 12/12/15  Yes Historical Provider, MD  sodium bicarbonate 650 MG tablet Take 650 mg by mouth 2 (two) times daily.   Yes Historical Provider, MD   Physical Exam: Filed Vitals:   01/02/16 0359 01/02/16 0416  BP:  148/73 168/72  Pulse:    Temp: 97.6 F (36.4 C) 97.5 F (36.4 C)  Resp: 20 18    BP 168/72 mmHg  Pulse 66  Temp(Src) 97.5 F (36.4 C) (Oral)  Resp 18  SpO2 98%  General Appearance:    Alert, oriented, no distress, appears stated age  Head:    Normocephalic, atraumatic  Eyes:    PERRL, EOMI, sclera non-icteric        Nose:   Nares without drainage or epistaxis. Mucosa, turbinates normal  Throat:   Moist mucous membranes. Oropharynx without erythema or exudate.  Neck:   Supple. No carotid bruits.  No thyromegaly.  No lymphadenopathy.   Back:     No CVA tenderness, no spinal tenderness  Lungs:     Clear to auscultation bilaterally, without wheezes, rhonchi or rales  Chest wall:    No tenderness to palpitation  Heart:    Regular rate and rhythm without murmurs, gallops, rubs  Abdomen:     Soft, non-tender, nondistended, normal bowel sounds, no organomegaly  Genitalia:    deferred  Rectal:    deferred  Extremities:   No clubbing, cyanosis or edema.  Pulses:   2+ and symmetric all extremities  Skin:   Skin color, texture, turgor normal, no rashes or lesions  Lymph nodes:   Cervical, supraclavicular, and axillary nodes normal  Neurologic:   CNII-XII intact. Normal strength, sensation and reflexes      throughout    Labs  on Admission:  Basic Metabolic Panel:  Recent Labs Lab 01/01/16 1644  NA 145  K 5.1  CL 114*  CO2 12*  GLUCOSE 131*  BUN 133*  CREATININE 18.32*  CALCIUM 6.7*   Liver Function Tests: No results for input(s): AST, ALT, ALKPHOS, BILITOT, PROT, ALBUMIN in the last 168 hours. No results for input(s): LIPASE, AMYLASE in the last 168 hours. No results for input(s): AMMONIA in the last 168 hours. CBC:  Recent Labs Lab 01/01/16 1455 01/01/16 1644  WBC  --  6.1  HGB 7.2* 7.2*  HCT  --  23.3*  MCV  --  93.2  PLT  --  144*   Cardiac Enzymes:  Recent Labs Lab 01/02/16 0300  TROPONINI <0.03    BNP (last 3 results) No results for input(s):  PROBNP in the last 8760 hours. CBG: No results for input(s): GLUCAP in the last 168 hours.  Radiological Exams on Admission: Dg Chest 2 View  01/02/2016  CLINICAL DATA:  82 year old female with chest pain and shortness of breath EXAM: CHEST  2 VIEW COMPARISON:  CT dated 12/04/2008 FINDINGS: Two views of the chest demonstrate emphysematous changes of the lungs. There is no focal consolidation, pleural effusion, or pneumothorax. Tiny nodular densities in the right upper lobe measuring approximately 5 mm. Moderate cardiomegaly. There is degenerative changes of the spine. No acute fracture. IMPRESSION: Emphysema.  No focal consolidation. Cardiomegaly. Electronically Signed   By: Anner Crete M.D.   On: 01/02/2016 02:05    EKG: Independently reviewed.  Assessment/Plan Principal Problem:   Symptomatic anemia Active Problems:   CKD (chronic kidney disease) stage 5, GFR less than 15 ml/min (HCC)   Metabolic acidosis, NAG, failure of bicarbonate regeneration   1. Symptomatic anemia - 1. Transfuse 2 units PRBC 2. Recheck h/h at noon 2. CKD stage 5 with BUN of 133 - 1. Patient is willing to have a PRBC transfusion if this has a chance of helping her feel better she says; however, patient is adamant that she refuses dialysis.  She understands that this may mean that she dies in the coming weeks, she understands that her lab numbers today look fairly bad, are getting worse, and certainly suggest that some or all of her symptoms may be due to renal failure.  Patient responds that "if it is her time to go, it is her time to go" that she "wants to die a natural death", that she "dosent want to be hooked up to a machine", that "she is going to die sometime anyhow", and that "I dont want to go through that [dialysis]". 2. We will comply with her wishes which she has made clear here in the ED. 3. May consider palliative care consult, but patient is already pursuing / made herself comfort measures even  without this.  Perhaps they have something to offer with hospice as ADLs (she was still driving as of yesterday) will become more difficult in the coming weeks. 3. Metabolic acidosis, NAG - due to worsening of metabolic acidosis, will increase PO bicarb to 1300 mg PO BID for now.  Repeat BMP at noon, likely warrants at least a curbside with nephrology on dosing of this before discharge.    Code Status: DNR/DNI  Family Communication: No family in room Disposition Plan: Admit to obs   Time spent: 70 min  GARDNER, JARED M. [facility] Pager [phone]  If 7AM-7PM, please contact the day team taking care of the patient [email or web address] Password [facility] 01/02/2016, 4:54 AM

## 2016-01-02 NOTE — Progress Notes (Signed)
Pt admitted to [id number] via stretcher from ED.  Pt AAO X4.  Pt on RA.  Pt has 20G to Rt AC SL and 20G to rt FA SL.  Pt has belongings to beside.  Report rcvd from RN in ED.  Pt has no complaints at the moment.  Will continue to monitor.

## 2016-01-03 DIAGNOSIS — D649 Anemia, unspecified: Secondary | ICD-10-CM | POA: Diagnosis not present

## 2016-01-03 LAB — TYPE AND SCREEN
ABO/RH(D): O POS
Antibody Screen: NEGATIVE
Unit division: 0
Unit division: 0

## 2016-01-03 LAB — BASIC METABOLIC PANEL
ANION GAP: 17 — AB (ref 5–15)
BUN: 132 mg/dL — AB (ref 6–20)
CHLORIDE: 115 mmol/L — AB (ref 101–111)
CO2: 11 mmol/L — ABNORMAL LOW (ref 22–32)
Calcium: 6.1 mg/dL — CL (ref 8.9–10.3)
Creatinine, Ser: 18.16 mg/dL — ABNORMAL HIGH (ref 0.44–1.00)
GFR calc Af Amer: 2 mL/min — ABNORMAL LOW (ref >60)
GFR calc non Af Amer: 2 mL/min — ABNORMAL LOW (ref >60)
Glucose, Bld: 87 mg/dL (ref 65–99)
POTASSIUM: 5.4 mmol/L — AB (ref 3.5–5.1)
SODIUM: 143 mmol/L (ref 135–145)

## 2016-01-03 LAB — CBC
HCT: 28.5 % — ABNORMAL LOW (ref 36.0–46.0)
HEMOGLOBIN: 9.1 g/dL — AB (ref 12.0–15.0)
MCH: 28.9 pg (ref 26.0–34.0)
MCHC: 31.9 g/dL (ref 30.0–36.0)
MCV: 90.5 fL (ref 78.0–100.0)
Platelets: 120 10*3/uL — ABNORMAL LOW (ref 150–400)
RBC: 3.15 MIL/uL — AB (ref 3.87–5.11)
RDW: 16.9 % — ABNORMAL HIGH (ref 11.5–15.5)
WBC: 8.7 10*3/uL (ref 4.0–10.5)

## 2016-01-03 LAB — ALBUMIN: ALBUMIN: 3 g/dL — AB (ref 3.5–5.0)

## 2016-01-03 NOTE — Discharge Summary (Signed)
Physician Discharge Summary  Sherri [name] [id number] DOB: 1932-09-14 DOA: 01/02/2016  PCP: Sherril Croon, MD  Admit date: 01/02/2016 Discharge date: 01/03/2016  Time spent: < 30 minutes  Recommendations for Outpatient Follow-up:  1. Discharge home with hospice   Discharge Diagnoses:  Principal Problem:   Symptomatic anemia Active Problems:   CKD (chronic kidney disease) stage 5, GFR less than 15 ml/min (HCC)   Metabolic acidosis, NAG, failure of bicarbonate regeneration  Discharge Condition: guarded  Diet recommendation: as tolerated  There were no vitals filed for this visit.  History of present illness:  See H&P, Labs, Consult and Test reports for all details in brief, patient is a 82 y.o. female with h/o CKD stage 5 but refuses hemodialysis, anemia of CKD being treated with erythropoetin injections managed by [facility] from the look of chart review. Patient presents to the ED with c/o episodic dizziness, head "feels funny" and near syncopal episode when driving to get her aranesp injection yesterday evening. Work up at nephrologists office showed lower than baseline hemoglobin in the 7 range. Patient was referred to the ED.  Hospital Course:  Symptomatic anemia - multifactorial, likely related to CKD, no evidence of bleeding. Transfused 2U, Hb responded appropriately, overall Hb 7.2 >> 9.1 Acute on CKD stage V - refusing HD, DNR/DNI, home with hospice. Palliative consulted while hospitalized.  Chronic metabolic acidosis - due to CKD, continue chronic bicarb Thrombocytopenia - in the setting of acute illness, monitor, improving Hyperkalemia - due to renal failure. Stable, tolerating well. Resume Lasix.  Goals of care - no life support, DNR, home with hospice  Procedures:  None    Consultations:  Palliative   Discharge Exam: Filed Vitals:   01/02/16 1337 01/02/16 2118 01/03/16 0647 01/03/16 1000  BP: 161/70 167/76 167/79 168/78  Pulse: 63 70 70 70  Temp:  97.9 F (36.6 C) 97.8 F (36.6 C) 97.6 F (36.4 C) 98.1 F (36.7 C)  TempSrc: Oral Oral Oral Oral  Resp: 15 16 16    SpO2: 100% 98% 99% 99%    General: NAD Cardiovascular: RRR Respiratory: CTA biL  Discharge Instructions Activity:  As tolerated   Get Medicines reviewed and adjusted: Please take all your medications with you for your next visit with your Primary MD  Please request your Primary MD to go over all hospital tests and procedure/radiological results at the follow up, please ask your Primary MD to get all Hospital records sent to his/her office.  If you experience worsening of your admission symptoms, develop shortness of breath, life threatening emergency, suicidal or homicidal thoughts you must seek medical attention immediately by calling 911 or calling your MD immediately if symptoms less severe.  You must read complete instructions/literature along with all the possible adverse reactions/side effects for all the Medicines you take and that have been prescribed to you. Take any new Medicines after you have completely understood and accpet all the possible adverse reactions/side effects.   Do not drive when taking Pain medications.   Do not take more than prescribed Pain, Sleep and Anxiety Medications  Special Instructions: If you have smoked or chewed Tobacco in the last 2 yrs please stop smoking, stop any regular Alcohol and or any Recreational drug use.  Wear Seat belts while driving.  Please note  You were cared for by a hospitalist during your hospital stay. Once you are discharged, your primary care physician will handle any further medical issues. Please note that NO REFILLS for any discharge medications will  be authorized once you are discharged, as it is imperative that you return to your primary care physician (or establish a relationship with a primary care physician if you do not have one) for your aftercare needs so that they can reassess your need for  medications and monitor your lab values.    Medication List    TAKE these medications        ARANESP IJ  Inject 1 Dose as directed once.     aspirin 81 MG tablet  Take 81 mg by mouth daily.     furosemide 80 MG tablet  Commonly known as:  LASIX  Take 80 mg by mouth daily as needed for fluid or edema.     metoprolol tartrate 25 MG tablet  Commonly known as:  LOPRESSOR  Take 25 mg by mouth daily.     potassium chloride 10 MEQ CR capsule  Commonly known as:  MICRO-K  Take 10 mEq by mouth 2 (two) times daily.     sodium bicarbonate 650 MG tablet  Take 650 mg by mouth 2 (two) times daily.           Follow-up Information    Follow up with hospice services at home.      The results of significant diagnostics from this hospitalization (including imaging, microbiology, ancillary and laboratory) are listed below for reference.    Significant Diagnostic Studies: Dg Chest 2 View  01/02/2016  CLINICAL DATA:  82 year old female with chest pain and shortness of breath EXAM: CHEST  2 VIEW COMPARISON:  CT dated 12/04/2008 FINDINGS: Two views of the chest demonstrate emphysematous changes of the lungs. There is no focal consolidation, pleural effusion, or pneumothorax. Tiny nodular densities in the right upper lobe measuring approximately 5 mm. Moderate cardiomegaly. There is degenerative changes of the spine. No acute fracture. IMPRESSION: Emphysema.  No focal consolidation. Cardiomegaly. Electronically Signed   By: Anner Crete M.D.   On: 01/02/2016 02:05   Labs: Basic Metabolic Panel:  Recent Labs Lab 01/01/16 1644 01/02/16 1220 01/03/16 0430  NA 145 143 143  K 5.1 5.2* 5.4*  CL 114* 114* 115*  CO2 12* 11* 11*  GLUCOSE 131* 87 87  BUN 133* 125* 132*  CREATININE 18.32* 18.46* 18.16*  CALCIUM 6.7* 6.6* 6.1*   Liver Function Tests:  Recent Labs Lab 01/03/16 0430  ALBUMIN 3.0*   CBC:  Recent Labs Lab 01/01/16 1455 01/01/16 1644 01/02/16 1219 01/02/16 1220  01/03/16 0430  WBC  --  6.1 7.7  --  8.7  NEUTROABS  --   --  6.1  --   --   HGB 7.2* 7.2* 9.9* 9.9* 9.1*  HCT  --  23.3* 30.6* 30.5* 28.5*  MCV  --  93.2 91.6  --  90.5  PLT  --  144* 118*  --  120*   Cardiac Enzymes:  Recent Labs Lab 01/02/16 0300  TROPONINI <0.03    Signed:  Marzetta Board  [facility] 01/03/2016, 12:09 PM

## 2016-01-03 NOTE — Discharge Instructions (Signed)
Follow with Sherril Croon, MD in 5-7 days  Please get a complete blood count and chemistry panel checked by your Primary MD at your next visit, and again as instructed by your Primary MD. Please get your medications reviewed and adjusted by your Primary MD.  Please request your Primary MD to go over all Hospital Tests and Procedure/Radiological results at the follow up, please get all Hospital records sent to your Prim MD by signing hospital release before you go home.  If you had Pneumonia of Lung problems at the Hospital: Please get a 2 view Chest X ray done in 6-8 weeks after hospital discharge or sooner if instructed by your Primary MD.  If you have Congestive Heart Failure: Please call your Cardiologist or Primary MD anytime you have any of the following symptoms:  1) 3 pound weight gain in 24 hours or 5 pounds in 1 week  2) shortness of breath, with or without a dry hacking cough  3) swelling in the hands, feet or stomach  4) if you have to sleep on extra pillows at night in order to breathe  Follow cardiac low salt diet and 1.5 lit/day fluid restriction.  If you have diabetes Accuchecks 4 times/day, Once in AM empty stomach and then before each meal. Log in all results and show them to your primary doctor at your next visit. If any glucose reading is under 80 or above 300 call your primary MD immediately.  If you have Seizure/Convulsions/Epilepsy: Please do not drive, operate heavy machinery, participate in activities at heights or participate in high speed sports until you have seen by Primary MD or a Neurologist and advised to do so again.  If you had Gastrointestinal Bleeding: Please ask your Primary MD to check a complete blood count within one week of discharge or at your next visit. Your endoscopic/colonoscopic biopsies that are pending at the time of discharge, will also need to followed by your Primary MD.  Get Medicines reviewed and adjusted. Please take all your  medications with you for your next visit with your Primary MD  Please request your Primary MD to go over all hospital tests and procedure/radiological results at the follow up, please ask your Primary MD to get all Hospital records sent to his/her office.  If you experience worsening of your admission symptoms, develop shortness of breath, life threatening emergency, suicidal or homicidal thoughts you must seek medical attention immediately by calling 911 or calling your MD immediately  if symptoms less severe.  You must read complete instructions/literature along with all the possible adverse reactions/side effects for all the Medicines you take and that have been prescribed to you. Take any new Medicines after you have completely understood and accpet all the possible adverse reactions/side effects.   Do not drive or operate heavy machinery when taking Pain medications.   Do not take more than prescribed Pain, Sleep and Anxiety Medications  Special Instructions: If you have smoked or chewed Tobacco  in the last 2 yrs please stop smoking, stop any regular Alcohol  and or any Recreational drug use.  Wear Seat belts while driving.  Please note You were cared for by a hospitalist during your hospital stay. If you have any questions about your discharge medications or the care you received while you were in the hospital after you are discharged, you can call the unit and asked to speak with the hospitalist on call if the hospitalist that took care of you is not available. Once  you are discharged, your primary care physician will handle any further medical issues. Please note that NO REFILLS for any discharge medications will be authorized once you are discharged, as it is imperative that you return to your primary care physician (or establish a relationship with a primary care physician if you do not have one) for your aftercare needs so that they can reassess your need for medications and monitor your  lab values.  You can reach the hospitalist office at phone [phone] or fax [phone]   If you do not have a primary care physician, you can call [phone] for a physician referral.  Activity: As tolerated with Full fall precautions use walker/cane & assistance as needed  Diet: as tolerated  Disposition Home with hospice

## 2016-01-03 NOTE — Care Management Obs Status (Signed)
[facility] NOTIFICATION   Patient Details  Name: Sherri [name] MRN: [id number] Date of Birth: 04-05-1932   Medicare Observation Status Notification Given:  Yes (Patient not wanting treatment . )    [name], RN 01/03/2016, 10:02 AM

## 2016-01-03 NOTE — Progress Notes (Signed)
Pt discharged to home.  Discharge instructions explained to pt.  Pt given DNR form to take home.  IV removed.  Pt states she has all belongings.  Pt taken off unit in wheelchair by NT.

## 2016-01-03 NOTE — Care Management Note (Signed)
Case Management Note  Patient Details  Name: Sherri [name] MRN: [id number] Date of Birth: November 09, 1931  Subjective/Objective:                    Action/Plan:  Spoke with Nunzio Cory of [facility] , she is on her way to hospital this am to assess patient .   Spoke with patient , patient aware of above and in agreement . Asked if patient had any family or friends she wanted involved , she stated she did not.   Will await Kathleen's assessment .  Expected Discharge Date:                  Expected Discharge Plan:  Home w Hospice Care  In-House Referral:     Discharge planning Services  CM Consult  Post Acute Care Choice:  Hospice Choice offered to:  Patient  DME Arranged:    DME Agency:     HH Arranged:    [place] Agency:  [facility]  Status of Service:  In process, will continue to follow  Medicare Important Message Given:    Date Medicare IM Given:    Medicare IM give by:    Date Additional Medicare IM Given:    Additional Medicare Important Message give by:     If discussed at [place] of Stay Meetings, dates discussed:    Additional Comments:  [name], RN 01/03/2016, 10:04 AM

## 2016-01-03 NOTE — Care Management (Addendum)
Patient planning to leave shortly . Manus Gunning at [facility] aware and will call patient directly.     Discharge summary faxed to [facility] [phone] .   Will call [facility] when patient leaves and let Manus Gunning or [place] know [facility] [phone]

## 2016-01-29 ENCOUNTER — Encounter (HOSPITAL_COMMUNITY): Payer: Medicare Other

## 2017-03-13 DEATH — deceased
